# Patient Record
Sex: Female | Born: 1935 | Race: White | Hispanic: No
Health system: Southern US, Community
[De-identification: ages and names within clinical notes are randomized; demographics above are authoritative.]

---

## 2001-01-09 ENCOUNTER — Encounter: Payer: Self-pay | Admitting: Orthopedic Surgery

## 2001-01-14 ENCOUNTER — Encounter: Payer: Self-pay | Admitting: Orthopedic Surgery

## 2001-01-14 ENCOUNTER — Inpatient Hospital Stay (HOSPITAL_COMMUNITY): Admission: RE | Admit: 2001-01-14 | Discharge: 2001-01-20 | Payer: Self-pay | Admitting: Orthopedic Surgery

## 2001-01-17 ENCOUNTER — Encounter: Payer: Self-pay | Admitting: Orthopedic Surgery

## 2001-01-18 ENCOUNTER — Encounter: Payer: Self-pay | Admitting: Orthopedic Surgery

## 2001-06-11 ENCOUNTER — Encounter: Payer: Self-pay | Admitting: Orthopedic Surgery

## 2001-06-13 ENCOUNTER — Encounter: Payer: Self-pay | Admitting: Orthopedic Surgery

## 2001-06-13 ENCOUNTER — Inpatient Hospital Stay (HOSPITAL_COMMUNITY): Admission: RE | Admit: 2001-06-13 | Discharge: 2001-06-18 | Payer: Self-pay | Admitting: Orthopedic Surgery

## 2005-09-15 ENCOUNTER — Ambulatory Visit: Payer: Self-pay | Admitting: Ophthalmology

## 2005-09-25 ENCOUNTER — Ambulatory Visit: Payer: Self-pay | Admitting: Ophthalmology

## 2005-11-23 ENCOUNTER — Ambulatory Visit: Payer: Self-pay | Admitting: Internal Medicine

## 2005-11-28 ENCOUNTER — Encounter: Payer: Self-pay | Admitting: Internal Medicine

## 2005-11-29 ENCOUNTER — Ambulatory Visit: Payer: Self-pay | Admitting: Internal Medicine

## 2005-12-11 ENCOUNTER — Ambulatory Visit: Payer: Self-pay | Admitting: Ophthalmology

## 2005-12-20 ENCOUNTER — Ambulatory Visit: Payer: Self-pay | Admitting: Ophthalmology

## 2006-01-18 ENCOUNTER — Ambulatory Visit: Payer: Self-pay | Admitting: Internal Medicine

## 2006-02-19 ENCOUNTER — Inpatient Hospital Stay: Payer: Self-pay | Admitting: Internal Medicine

## 2006-07-06 ENCOUNTER — Other Ambulatory Visit: Payer: Self-pay

## 2006-07-06 ENCOUNTER — Emergency Department: Payer: Self-pay | Admitting: Emergency Medicine

## 2006-09-20 ENCOUNTER — Ambulatory Visit: Payer: Self-pay | Admitting: Internal Medicine

## 2006-10-08 ENCOUNTER — Ambulatory Visit: Payer: Self-pay | Admitting: Unknown Physician Specialty

## 2007-01-22 ENCOUNTER — Ambulatory Visit: Payer: Self-pay | Admitting: Internal Medicine

## 2007-07-24 ENCOUNTER — Ambulatory Visit: Payer: Self-pay | Admitting: Internal Medicine

## 2007-09-04 ENCOUNTER — Ambulatory Visit (HOSPITAL_COMMUNITY): Admission: RE | Admit: 2007-09-04 | Discharge: 2007-09-04 | Payer: Self-pay | Admitting: *Deleted

## 2008-03-02 ENCOUNTER — Ambulatory Visit: Payer: Self-pay | Admitting: Internal Medicine

## 2008-03-25 ENCOUNTER — Ambulatory Visit: Payer: Self-pay | Admitting: Internal Medicine

## 2008-07-21 ENCOUNTER — Ambulatory Visit: Payer: Self-pay

## 2008-08-10 ENCOUNTER — Ambulatory Visit: Payer: Self-pay | Admitting: Otolaryngology

## 2008-08-10 ENCOUNTER — Other Ambulatory Visit: Payer: Self-pay

## 2008-08-20 ENCOUNTER — Ambulatory Visit: Payer: Self-pay | Admitting: Otolaryngology

## 2009-04-06 ENCOUNTER — Ambulatory Visit: Payer: Self-pay | Admitting: Internal Medicine

## 2009-04-08 ENCOUNTER — Ambulatory Visit: Payer: Self-pay | Admitting: Internal Medicine

## 2009-05-04 ENCOUNTER — Ambulatory Visit: Payer: Self-pay | Admitting: Surgery

## 2009-07-23 ENCOUNTER — Ambulatory Visit: Payer: Self-pay | Admitting: Internal Medicine

## 2009-07-28 ENCOUNTER — Encounter: Payer: Self-pay | Admitting: Cardiovascular Disease

## 2010-04-11 ENCOUNTER — Ambulatory Visit: Payer: Self-pay | Admitting: Internal Medicine

## 2010-08-25 ENCOUNTER — Emergency Department: Payer: Self-pay | Admitting: Emergency Medicine

## 2010-10-13 ENCOUNTER — Ambulatory Visit: Payer: Self-pay | Admitting: Surgery

## 2011-03-28 NOTE — Cardiovascular Report (Signed)
Ashley Roth, Ashley Roth               ACCOUNT NO.:  [id number]   MEDICAL RECORD NO.:  [id number]          PATIENT TYPE:  OIB   LOCATION:  2854                         FACILITY:  [facility]   PHYSICIAN:  Darlin Priestly, MD  DATE OF BIRTH:  Nov 17, 1927   DATE OF PROCEDURE:  DATE OF DISCHARGE:  09/04/2007                            CARDIAC CATHETERIZATION   PROCEDURE:  1. Right heart catheterization.  2. Left heart catheterization.  3. Coronary angiography.  4. Left ventriculogram.  5. Abdominal aortogram.   ATTENDING PHYSICIAN:  Darlin Priestly, MD   COMPLICATIONS:  None.   INDICATIONS:  [name] is a 77 year old female patient of Dr. Bethann Punches in [place], with a history of non-insulin dependent diabetes  mellitus, hypertension, hypercholesterolemia, history of ongoing  shortness of breath and substernal chest tightness.  She is now referred  for cardiac catheterization to rule out significant CAD and evaluate  right heart pressures.   DESCRIPTION OF OPERATION:  After obtaining informed consent, the patient  brought to the cardiac cath lab.  Right groin was shaved, prepped and  draped in sterile fashion.  Received Monistat and modified Seldinger  technique, #7 French venous sheath to the right femoral vein, a #6  [place] arterial sheath to the right femoral artery.  Next under  fluoroscopic guidance, a #7 [place] venous sheath to the right femoral  vein, a #6 [place] arterial sheath to the right femoral artery.  Next,  under fluoroscopic guidance, a #7 [place] Swan-Ganz catheter inserted  into the RA/RV/PA moist position.  The hemodynamic measurements were  obtained.   A #6 French diagnostic catheter was performed via diagnostic  angiography.   Left main is a short large vessel with no significant disease.   The LAD is a medium sized vessel with 2 diagonal branches and a large  septal perforated.  The LAD has no significant disease.   First and second diagonals are small  to medium sized vessels with no  significant disease.   Left coronary artery is a medium sized ramus that bifurcates distal  with no significant disease.   Left circumflex is a medium sized vessel in view with one obtuse  marginal branch.  The AV circumflex has no significant disease.   The first 2 images show a medium sized vessel with 40% ostial lesion.   The right coronary artery is a large vessel, which dominant and gives  rise to a PDA and posterolateral branch.  There is no significant  disease in the RCA, PDA or posterolateral branch.   Left ventriculogram was observed with EF of 60%.   Abdominal aortogram was 60% left distal renal artery fibromuscular  dysplasia.   Hemodynamics:  RA 7, RV 44/6, PA 40/15, pulmonary arterial pressures of  16, systemic arterial pressures 195/79, LV segment pressure 195/10, LV  of 18, cardiac output 5.4, cardiac index 2.9, PA saturation is 67%, SVC  saturation is 95%.   CONCLUSION:  1. Noncritical coronary artery disease (CAD).  2. Normal LV systolic function.  3. 60% left renal artery narrowing secondary to distal fibromuscular  dysplasia.  4. Mild pulmonary hypertension.  5. Cardiac output 5.4, cardiac index 2.9.  6. PA saturation is 67%, SVC saturation is 95%.      Darlin Priestly, MD  Electronically Signed     RHM/MEDQ  D:  09/04/2007  T:  09/05/2007  Job:  [id number]   cc:   Bethann Punches

## 2011-03-31 NOTE — Op Note (Signed)
[place]. [facility]  Patient:    Ashley Roth, Ashley Roth                        MRN: [id number] Proc. Date: 01/14/01 Adm. Date:  [id number] Attending:  Colbert Ewing                           Operative Report  PREOPERATIVE DIAGNOSIS:  End stage degenerative arthritis, right knee with varus alignment and flexion contracture.  POSTOPERATIVE DIAGNOSIS:  End stage degenerative arthritis, right knee with varus alignment and flexion contracture.  OPERATION PERFORMED:  Right total knee replacement utilizing Osteonics prosthesis.  Press-fit #5 posterior stabilizing femoral component.  Cemented #5 tibial component with 12 mm polyethylene insert.  Cemented recessed nonmetal back 26 mm patellar component.  Appropriate soft tissue balancing.  SURGEON:  Loreta Ave, M.D.  ASSISTANT:  Arlys John D. Petrarca, P.A.-C.  ANESTHESIA:  General.  ESTIMATED BLOOD LOSS:  Minimal.  TOURNIQUET TIME:  One hour 30 minutes.  SPECIMENS:  Excised bone and soft tissue.  CULTURES:  None.  COMPLICATIONS:  None.  DRAINS:  Hemovac  x 2.  DRESSING:  Soft compressive with knee immobilizer.  DESCRIPTION OF PROCEDURE:  The patient was brought to the operating room and placed on the operating table in supine position.  After adequate anesthesia had been obtained, right knee examined.  5 to 7 degree flexion contracture, further flexion to 100 degrees.  Alignment in varus with stable ligaments correctable to just about neutral.  Tourniquet applied.  Prepped and draped in the usual sterile fashion.  Exsanguinated with elevation and Esmarch. Tourniquet inflated to 325 mmHg.  Straight incision above the patella down to the tibial tubercle.  Medial parapatellar arthrotomy with appropriate hemostasis with cautery.  Knee exposed.  Grade 4 changes throughout, most marked medially.  Moderate contracture including PCL.  ACL, PCL, remnants of meniscu, hypertrophic tissue, partiarticular spurs  and extensive fat pad all debrided.  Distal femur exposed.  Intramedullary guide placed.  Distal cut set at 5 degrees valgus.  Relatively osteopenic in the distal femur.  Sized to a #5 component.  Jigs put in place.  Definitive cuts made for the posterior stabilizing component.  Degree of osteopenia and osteoporosis distal femur evident and the distal femur was packed with bony cuts to fill the cancellous void in that area. The trial prosthesis put in place and found to fit well with excellent capturing, fixation and alignment.  Trial removed.  All loose bodies and spurs removed.  Tibial spine cut with a saw with appropriate retractors in place.  Sized to a #5 component.  Intramedullary guide placed. A 6 mm cut off the deficient medial side with a 5 degree posterior slope cut. Trials put in place.  With a 12 mm insert, excellent stability, alignment, flexion and extension set at 5 degrees of valgus. Tibial was marked for rotation and then hand reamed.  The patella was then sized, reamed and drilled for a 26 mm component.  The knee examined with all trials in place.  Full extension, full flexion, good stability, good alignment, no component lift off with  flexion and good patellofemoral tracking.  There had been a significant release medially to be able to get the knee into good alignment despite valgus.  Wound irrigated after all trials removed.  Pulse irrigating device used to further irrigate this.  Cement prepared and placed on the tibial component which  was hammered in place.  Polyethylene attached.  Patellar component seated and held in place with a clamp after excessive cement removed.  Femoral component seated.  Knee reduced.  Once the cement had hardened, the knee was re-examined with good extension, good flexion, good stability, good alignment and good patellofemoral tracking.  Hemovacs placed and brought out through separate stab wounds.  Arthrotomy closed with #1 Vicryl, skin and  subcutaneous tissues with Vicryl and staples.  Margins of the wound and knee injected with Marcaine.  Sterile compressive dressing applied. Tourniquet deflated and removed.  Knee immobilizer applied.  Anesthesia reversed.  Brought to recovery room.  Tolerated surgery well.  No complications. DD:  01/14/01 TD:  01/14/01 Job: [id number]

## 2011-03-31 NOTE — Procedures (Signed)
[place]. [facility]  Patient:    Ashley Roth, Ashley Roth                        MRN: [id number] Proc. Date: 06/13/01 Adm. Date:  06/13/01 Attending:  Judie Petit, M.D.                           Procedure Report  PROCEDURE: Epidural placement.  ANESTHESIOLOGIST: Judie Petit, M.D.  INDICATIONS FOR PROCEDURE: The patient is a 77 year old female, who was scheduled to have left total knee replacement by Dr. Eulah Pont today.  Dr. Eulah Pont and the patient requested epidural for postoperative pain management.  The procedure was discussed in detail with the patient preoperatively.  The risks of bleeding, nerve damage, spinal headache, etc. were discussed in detail with the patient.  Questions were answered and consent given.  The patient understood the procedure would be performed in the operating room after the procedure while under general anesthesia.  DESCRIPTION OF PROCEDURE: The patient tolerated the above-mentioned procedure well.  The patient was placed in the lateral decubitus position and the L3-4 interspace subsequently prepped and draped with Betadine.  A 17 gauge Tuohy needle was used with loss of resistance technique with preservative-free normal saline.  There was no heme or CSF aspiration.  Test dose was negative with 3 cc of 1.5% xylocaine with epinephrine 1:200,000.  The catheter was placed 3 cm without difficulty.  The needle was subsequently withdrawn without difficulty.  The catheter was then affixed to the patients back.  The patient was turned to the supine position and extubated and taken to the post anesthesia care unit.  The patient will be placed on a Marcaine and Fentanyl infusion and will be followed by the anesthesia team.  The patient had been placed in the left lateral decubitus position. DD:  06/13/01 TD:  06/14/01 Job: [id number]

## 2011-03-31 NOTE — Procedures (Signed)
[place]. [facility]  Patient:    Ashley Roth, Ashley Roth                        MRN: [id number] Proc. Date: 01/14/01 Adm. Date:  [id number] Attending:  Colbert Ewing                           Procedure Report  PROCEDURE PERFORMED:  Epidural catheter placement.  ANESTHESIOLOGIST:  Janetta Hora. Gelene Mink, M.D.  INDICATIONS FOR PROCEDURE:  I was consulted by Dr. Richardson Landry to provide postoperative pain relief for his patient, Ashley Roth.  We decided this should take the form of epidural catheter placement and postoperative monitoring by the anesthesiologist.  In the preoperative period the risks and benefits of the of the epidural catheter placement and therapy were discussed with the patient in the preoperative holding area.  The patient expressed some questions related to her current chronic low back pain.  We discussed the natural course of low back pain and its relationship with epidural catheter  placement.  After the patient considered this and had her questions answered, she wished to have the epidural catheter placed and utilized for postoperative analgesia.  DESCRIPTION OF PROCEDURE:  At the completion of the operative procedure, the patient was turned to the right lateral decubitus position.  The back was sprayed liberally with Betadine solution, the area was draped.  A #18 Husted needle was used in a paramedian approach at the L1-2 interspace.  A loss of resistance technique with air was used.  The epidural space was located easily on the first attempt.  A Buerhenne catheter was passed 5 cm into the epidural space.  The catheter passed easily.  The needle was removed without apparant movement of the catheter.  The catheter was aspirated.  There was no evidence of cerebrospinal fluid or blood.  The catheter was injected with a solution containing 10 cc of sterile preservative free normal saline and 5 cc of 1% lidocaine plain.  The catheter injected  easily.  Repeat aspiration revealed no evidence of cerebrospinal fluid or blood.  The catheter was taped securely  in place.  The patient was placed in a supine position and emerged uneventfully from the anesthetic.  The patient was brought to recovery room in good condition. DD:  01/14/01 TD:  01/14/01 Job: [id number]

## 2011-03-31 NOTE — Discharge Summary (Signed)
[place]. [facility]  Patient:    Ashley Roth, Ashley Roth Visit Number: [id number] MRN: [id number]          Service Type: SUR Location: [id number] Attending Physician:  Colbert Ewing Dictated by:   Oris Drone Petrarca, P.A.-C. Admit Date:  06/13/2001 Discharge Date: 06/18/2001                             Discharge Summary  ADMISSION DIAGNOSES:  Advanced degenerative joint disease of the left knee.  DISCHARGE DIAGNOSES: 1. Advanced degenerative joint disease of the left knee. 2. Urinary tract infection. 3. History of hypertension. 4. Diabetes mellitus type 2, non-insulin-dependent. 5. Obesity.  PROCEDURE:  Left total knee replacement.  HISTORY OF PRESENT ILLNESS:  This patient is a 77 year old female status post right total knee replacement.  She has done very well with the right which was performed in February 2002.  She is now having pain with ambulation and activities of daily living with her left knee.  She has failed conservative treatment.  She is not indicated for a left total knee replacement.  HOSPITAL COURSE:  A 77 year old female admitted June 13, 2001 after appropriate laboratory studies were obtained as well as 1 g vancomycin IV on-call to the operating room.  Was taken to the operating room where she underwent a left total knee replacement.  She tolerated the procedure well. An epidural was placed postoperatively for pain management.  Heparin 5000 units subcutaneous q.12h. was started until her Coumadin became therapeutic. She was continued with her preoperative medicines.  Consultation with PT/OT, rehabilitation, social service was ordered.  A CPM was placed 0-60 degrees for eight hours per day, incremented by 10 degrees a day.  Knee immobilizer for ambulation.  She will be able to ambulate, weightbearing as tolerated on the left with a walker.  She was placed on ADA diet.  Her Hemovacs were pulled and on postoperative day #2.  She  was begun on Diflucan 150 mg p.o. and Cipro 500 mg p.o. b.i.d. for seven days on June 14, 2001 for a urinary tract infection. Her epidural was discontinued June 14, 2001.  A PCA morphine pump was begun on the second for pain management once her epidural was discontinued.  PCA was discontinued on August 3 and she was placed on Darvocet-N 100 one to two q.4h. p.r.n. pain or Vicodin ES one to two q.4h. p.r.n. pain.  The remainder of her hospital course was uneventful and she was discharged on June 18, 2001 where she will return back to the office in one week for staple removal.  She was discharged in improved condition.  LABORATORIES:  Radiographic studies of August 1 revealed total knee replacement in expected position.  No evidence of fracture or dislocation.  July 30:  Hemoglobin 13.5, hematocrit 40.1%, white count 9200, platelets 391,000.  August 5:  Hemoglobin 8.9, hematocrit 26.3%, white count 9600, platelets 379,000.  Chemistries July 30:  Sodium 138, potassium 4.2, chloride 101, CO2 27, glucose 75, BUN 16, creatinine 0.9, calcium 9.9, total protein 6.9, albumin 4.0, AST 25, ALT 21, ALP 86, total bilirubin 0.7.  June 17, 2001:  Sodium 138, potassium 3.3, chloride 100, CO2 29, glucose 113, BUN 6, creatinine 0.9, calcium 8.5.  Urinalysis showed a benign urine on July 30 except for 3-6 whites, 0-2 reds, and many bacteria.  Her blood type was O+. Antibody screen was positive with an antibody of anti-C.  DISCHARGE  MEDICATIONS: 1. Percocet 5/325 one to two tablets q.4h. p.r.n. pain. 2. Colace 100 mg p.o. b.i.d. 3. Coumadin 5 mg one q.d. unless adjusted by home health. 4. Cipro one tablet 500 mg b.i.d. for five days.  ACTIVITY:  Ambulate as tolerated with no restrictions.  Weightbearing as tolerated.  DIET:  House diet.  WOUND CARE:  Keep wound clean and dry.  DISCHARGE INSTRUCTIONS:  Call if she has any problems in the interim.  FOLLOW-UP:  She will follow back up in about  7-10 days for staple removal in the office.  CONDITION ON DISCHARGE:  Improved. Dictated by:   Oris Drone Petrarca, P.A.-C. Attending Physician:  Colbert Ewing DD:  08/01/01 TD:  08/01/01 Job: [id number]

## 2011-03-31 NOTE — Op Note (Signed)
[place]. [facility]  Patient:    Ashley Roth, Ashley Roth                        MRN: [id number] Proc. Date: 01/14/01 Adm. Date:  [id number] Attending:  Colbert Ewing                           Operative Report  PREOPERATIVE DIAGNOSIS:  End-stage degenerative arthritis - right knee with varus alignment and flexion contracture.  POSTOPERATIVE DIAGNOSIS:  End-stage degenerative arthritis - right knee with varus alignment and flexion contracture.  OPERATIVE PROCEDURE:  Right total knee replacement utilizing Osteonics prosthesis.  Press-fit #5, posterior stabilizing femoral component, cemented #5 tibial component with 12 mm polyethylene insert.  Cemented, recessed, nonmetal-backed 26 mm patella component.  Appropriate soft tissue balancing.  SURGEON:  Loreta Ave, M.D.  ASSISTANT:  Arlys John D. Petrarca, P.A.-C.  ANESTHESIA:  General.  BLOOD LOSS:  Minimal.  TOURNIQUET TIME:  One hour and 30 minutes.  SPECIMENS:  Excised bone and soft tissue.  CULTURES:  None.  COMPLICATIONS:  None.  DRESSING:  Soft compressive with knee immobilizer.  DRAINS:  Hemovac x 2.  DESCRIPTION OF PROCEDURE:  Patient was brought to the operating room, placed on operating table in supine position.  After adequate anesthesia had been obtained, right knee examined.  Five to 7 degree flexion contracture, further flexion to 100 degrees.  Alignment in varus with stable ligaments correctable to just about neutral.  Tourniquet applied, prepped and draped in usual sterile fashion.  Exsanguinated with elevation, Esmarch and tourniquet inflated to 325 mmHg.  Straight incision above the patella down to the tibial tubercle.  Medial parapatellar arthrotomy with appropriate hemostasis with cautery.  Knee exposed.  Grade 4 changes throughout, most marked medially. Moderate contracture including PCL.  ACL, PCL, and remnants of menisci, hypertrophic tissue, periarticular spurs and  excessive fat pad all debrided. Distal femur exposed.  Intramedullary guide placed.  Distal cut set at 5 degrees of valgus.  Relatively osteopenic in the distal femur.  Sized for a #5 component.  Jigs put into place and definitive cuts made for the posterior stabilizing component.  Degree of osteopenia and osteoporosis distal femur evident and the distal femur was packed with bony cuts to fill the cancellous void in that area.  The trial prosthesis put in place and found to fit well with excellent capturing, fixation and alignment.  Trial removed.  All loose bodies and spurs removed.  Tibial spine cut with a saw with appropriate retractors in place.  Sized for a #5 component.  Intramedullary guide placed. A 6 mm cut off the deficient medial side with a 5 degree posterior slope cut. Trials put in place.  With the 12 mm insert, excellent stability, alignment, flexion and extension set at 5 degrees of valgus.  Tibia was marked for rotation and then hand reamed.  Patella was sized, reamed and drilled for a 26 mm component.  Knee examined with all trials in place.  Full extension, full flexion, good stability, good alignment, no component lift-off with flexion and good patellofemoral tracking.  There had been a significant release medially to be able to get the knee into good alignment in slight valgus. Wound irrigated after all trials removed.  Pulse irrigating device used to further irrigate this.  Cement prepared, placed on the tibial component, which was hammered in place.  Polyethylene attached.  Patellar component seated and  held in place with a clamp after excessive cement removed.   Femoral component seated.  Knee reduced.  Once the cement had hardened, the knee was re-examined with good extension, good flexion, good stability, good alignment and good patellofemoral tracking.  Hemovacs placed and brought out through separate stab wounds.  Arthrotomy closed with #1 Vicryl.  Skin and  subcutaneous tissue with Vicryl and staples.  Margins of the wound and knee injected with Marcaine.  Sterile compressive dressing applied, tourniquet deflated and removed, knee immobilizer applied.  Anesthesia reversed, brought to recovery room.  Tolerated surgery well, no complications. DD:  01/14/01 TD:  01/14/01 Job: [id number]

## 2011-03-31 NOTE — Op Note (Signed)
[place]. [facility]  Patient:    Ashley Roth, Ashley Roth                        MRN: [id number] Proc. Date: 06/13/01 Adm. Date:  06/13/01 Attending:  Loreta Ave, M.D.                           Operative Report  PREOPERATIVE DIAGNOSES:  End-stage degenerative joint disease left knee, with varus alignment and flexion contracture.  POSTOPERATIVE DIAGNOSES:  End-stage degenerative joint disease left knee, with varus alignment and flexion contracture.  PROCEDURE:  Left total knee replacement Osteonics prosthesis, press-fit #5 femoral component, posterior stabilizing.  Cemented #5 tibial component with 10 mm polyethylene insert.  Cemented recess nonmetal backed patellar component 26 mm.  Appropriate soft tissue balance and lateral retinacular release.  SURGEON:  Loreta Ave, M.D.  ASSISTANT:  Arlys John D. Petrarca, P.A.-C.  ANESTHESIA:  General.  ESTIMATED BLOOD LOSS:  Minimal.  TOURNIQUET TIME:  One hour 10 minutes.  SPECIMENS:  Specimens excised, bone and soft tissue.  CULTURES:  None.  COMPLICATIONS:  None.  DRESSINGS:  Soft compressive with knee immobilizer.  DRAINS:  Hemovac x 2.  DESCRIPTION OF PROCEDURE:  The patient was brought to the operating room and placed on the operating table in supine position.  After adequate anesthesia had been obtained, the left knee was examined.  Five degree flexion contracture, alignment in varus, correctable to neutral.  Ligament stable. Further flexion to 100 degrees.  Tourniquet applied, prepped and draped in the usual sterile fashion.  Exsanguinated with elevation of the Esmarch. Tourniquet inflated to 350 mm of mercury.  Straight incision above the patella down to the tibial tubercle.  Skin and subcutaneous tissue divided.  Medial parapatellar arthrotomy.  KNee exposed.  Periarticular spurring, contracted cruciate ligaments, remnants of menisci, hypertrophic tissue, surrounding spurs, and lose bodies all  removed.  Distal femur exposed.  Intermedullary guide put placed.  Distal cut removing 10 mm set at 5 degrees of valgus.  Sized to a #5 component.  Jigs put in place, definitive cuts made for the posterior stabilizing component.  Trial put in place and found to fit well. Trial removed.  Tibia exposed with appropriate retractors protecting posterior and collateral structures.  Tibial spine was removed with a saw.  Intermedullary guide placed.  Proximal cut removing 4 mm with a 5 degree posterior slope cut.  Trials put in place. Nicely balanced with full extension, full flexion, and good balancing of the knee with a 10 mm insert.  Tibia marked for rotation and then reamed for the cruciate portion of the component.  Trials reinserted.  Patella was sized, reamed and drilled for 26 mm component.  Lateral release performed as it was necessary to balance the patellofemoral joint.  Performed from inside out with cautery.  Once complete good tracking.  All trials removed.  Copious irrigation with a pulse irrigating device.  Cement prepared and placed on the tibial and patellar components which were compressed in place.  Excessive cement removed.  Polyethylene attached to the tibia.  The femoral component hammered in place.  The knee reduced.  Reexamined with full extension, full flexion, nicely balanced, stable, with good stability set at 5 degrees of valgus.  Good patellofemoral tracking after lateral release.  Clamps were held in place until the cement had completely hardened.  The knee was reexamined again with very acceptable motion alignment  and stability. Wounds irrigated.  A Hemovac was placed and brought out through separate stab wounds.  The wound closed with 1 Vicryl in the arthrotomy, Vicryl and staples in the subcutaneous tissue and skin, margins of the wound and knee injected with Marcaine and Hemovac clamped.  Sterile compressive dressing applied. Tourniquet deflated and removed.   The immobilizer applied.  Anesthesia reversed, brought to recovery room, tolerated surgery well.  NO complications. DD:  06/13/01 TD:  06/13/01 Job: [id number]

## 2011-04-19 ENCOUNTER — Ambulatory Visit: Payer: Self-pay | Admitting: Internal Medicine

## 2011-08-11 ENCOUNTER — Observation Stay: Payer: Self-pay | Admitting: Internal Medicine

## 2011-08-23 LAB — POCT I-STAT 3, ART BLOOD GAS (G3+)
Acid-Base Excess: 1
Bicarbonate: 24.8 — ABNORMAL HIGH
O2 Saturation: 95
Operator id: 172131
TCO2: 26
pCO2 arterial: 36.6
pH, Arterial: 7.44 — ABNORMAL HIGH
pO2, Arterial: 70 — ABNORMAL LOW

## 2011-08-23 LAB — POCT I-STAT 3, VENOUS BLOOD GAS (G3P V)
Acid-base deficit: 1
Bicarbonate: 23.9
O2 Saturation: 67
Operator id: 172131
TCO2: 25
pCO2, Ven: 39.6 — ABNORMAL LOW
pH, Ven: 7.388 — ABNORMAL HIGH
pO2, Ven: 35

## 2012-04-19 ENCOUNTER — Ambulatory Visit: Payer: Self-pay | Admitting: Internal Medicine

## 2012-04-30 ENCOUNTER — Ambulatory Visit: Payer: Self-pay

## 2012-05-01 ENCOUNTER — Ambulatory Visit: Payer: Self-pay | Admitting: Oncology

## 2012-05-02 ENCOUNTER — Ambulatory Visit: Payer: Self-pay | Admitting: Oncology

## 2012-05-02 LAB — CBC CANCER CENTER
Bands: 2 %
Basophil: 2 %
Eosinophil: 1 %
HCT: 28.1 % — ABNORMAL LOW (ref 35.0–47.0)
HGB: 9.1 g/dL — ABNORMAL LOW (ref 12.0–16.0)
Lymphocytes: 14 %
MCH: 28.1 pg (ref 26.0–34.0)
MCHC: 32.5 g/dL (ref 32.0–36.0)
MCV: 87 fL (ref 80–100)
Platelet: 85 x10 3/mm — ABNORMAL LOW (ref 150–440)
RBC: 3.25 10*6/uL — ABNORMAL LOW (ref 3.80–5.20)
Segmented Neutrophils: 64 %
WBC: 7.9 x10 3/mm (ref 3.6–11.0)

## 2012-05-02 LAB — COMPREHENSIVE METABOLIC PANEL
Albumin: 3.8 g/dL (ref 3.4–5.0)
Alkaline Phosphatase: 98 U/L (ref 50–136)
BUN: 13 mg/dL (ref 7–18)
Calcium, Total: 8.9 mg/dL (ref 8.5–10.1)
Chloride: 103 mmol/L (ref 98–107)
Co2: 26 mmol/L (ref 21–32)
Creatinine: 0.83 mg/dL (ref 0.60–1.30)
EGFR (African American): >60
EGFR (Non-African Amer.): >60
Osmolality: 279 (ref 275–301)
Potassium: 4.1 mmol/L (ref 3.5–5.1)
SGOT(AST): 27 U/L (ref 15–37)
Sodium: 137 mmol/L (ref 136–145)
Total Protein: 6.8 g/dL (ref 6.4–8.2)

## 2012-05-02 LAB — LACTATE DEHYDROGENASE: LDH: 579 U/L — ABNORMAL HIGH (ref 84–246)

## 2012-05-13 ENCOUNTER — Ambulatory Visit: Payer: Self-pay | Admitting: Oncology

## 2012-05-15 LAB — CBC CANCER CENTER
Basophil #: 0.1 x10 3/mm (ref 0.0–0.1)
Basophil %: 0.9 %
Eosinophil #: 0.2 x10 3/mm (ref 0.0–0.7)
HCT: 28.8 % — ABNORMAL LOW (ref 35.0–47.0)
Lymphocyte #: 1.4 x10 3/mm (ref 1.0–3.6)
Lymphocyte %: 14.3 %
MCH: 28.5 pg (ref 26.0–34.0)
MCHC: 32.9 g/dL (ref 32.0–36.0)
MCV: 87 fL (ref 80–100)
Monocyte #: 1.9 x10 3/mm — ABNORMAL HIGH (ref 0.2–0.9)
Neutrophil #: 6.4 x10 3/mm (ref 1.4–6.5)
RDW: 22.5 % — ABNORMAL HIGH (ref 11.5–14.5)
WBC: 10 x10 3/mm (ref 3.6–11.0)

## 2012-05-22 LAB — CBC CANCER CENTER
Basophil #: 0.1 x10 3/mm (ref 0.0–0.1)
Basophil %: 1.3 %
Eosinophil #: 0.2 x10 3/mm (ref 0.0–0.7)
Eosinophil %: 2.1 %
HCT: 27.9 % — ABNORMAL LOW (ref 35.0–47.0)
MCH: 28.3 pg (ref 26.0–34.0)
MCV: 87 fL (ref 80–100)
Monocyte #: 1.9 x10 3/mm — ABNORMAL HIGH (ref 0.2–0.9)
Monocyte %: 18.9 %
Neutrophil #: 6.7 x10 3/mm — ABNORMAL HIGH (ref 1.4–6.5)
Neutrophil %: 64.9 %
Platelet: 83 x10 3/mm — ABNORMAL LOW (ref 150–440)
RBC: 3.2 10*6/uL — ABNORMAL LOW (ref 3.80–5.20)
RDW: 23.3 % — ABNORMAL HIGH (ref 11.5–14.5)
WBC: 10.3 x10 3/mm (ref 3.6–11.0)

## 2012-05-29 LAB — CBC CANCER CENTER
Bands: 8 %
Basophil #: 0.1 x10 3/mm (ref 0.0–0.1)
Basophil %: 0.7 %
Basophil: 1 %
Eosinophil #: 0.2 x10 3/mm (ref 0.0–0.7)
Eosinophil %: 2.4 %
HCT: 27.1 % — ABNORMAL LOW (ref 35.0–47.0)
HGB: 8.9 g/dL — ABNORMAL LOW (ref 12.0–16.0)
Lymphocyte #: 1.2 x10 3/mm (ref 1.0–3.6)
MCH: 28.7 pg (ref 26.0–34.0)
MCHC: 32.9 g/dL (ref 32.0–36.0)
MCV: 87 fL (ref 80–100)
Metamyelocyte: 2 %
Monocyte %: 18.5 %
Monocytes: 12 %
Myelocyte: 2 %
NRBC/100 WBC: 4 /100
Neutrophil #: 5.4 x10 3/mm (ref 1.4–6.5)
Neutrophil %: 64.2 %
Platelet: 80 x10 3/mm — ABNORMAL LOW (ref 150–440)
RBC: 3.12 10*6/uL — ABNORMAL LOW (ref 3.80–5.20)
RDW: 22.9 % — ABNORMAL HIGH (ref 11.5–14.5)

## 2012-05-29 LAB — COMPREHENSIVE METABOLIC PANEL
Albumin: 3.9 g/dL (ref 3.4–5.0)
Alkaline Phosphatase: 98 U/L (ref 50–136)
Anion Gap: 9 (ref 7–16)
BUN: 14 mg/dL (ref 7–18)
Bilirubin,Total: 0.8 mg/dL (ref 0.2–1.0)
Chloride: 98 mmol/L (ref 98–107)
Creatinine: 1.09 mg/dL (ref 0.60–1.30)
EGFR (African American): 55 — ABNORMAL LOW
Glucose: 239 mg/dL — ABNORMAL HIGH (ref 65–99)
Potassium: 4.4 mmol/L (ref 3.5–5.1)
SGOT(AST): 29 U/L (ref 15–37)
Total Protein: 6.7 g/dL (ref 6.4–8.2)

## 2012-06-03 ENCOUNTER — Ambulatory Visit: Payer: Self-pay | Admitting: Gastroenterology

## 2012-06-04 LAB — CBC CANCER CENTER
Basophil: 1 %
Eosinophil: 3 %
HCT: 26.6 % — ABNORMAL LOW (ref 35.0–47.0)
HGB: 8.8 g/dL — ABNORMAL LOW (ref 12.0–16.0)
Lymphocytes: 15 %
MCHC: 32.9 g/dL (ref 32.0–36.0)
MCV: 87 fL (ref 80–100)
Monocytes: 10 %
Myelocyte: 2 %
NRBC/100 WBC: 4 /100
Platelet: 83 x10 3/mm — ABNORMAL LOW (ref 150–440)
Segmented Neutrophils: 55 %
WBC: 7.1 x10 3/mm (ref 3.6–11.0)

## 2012-06-13 ENCOUNTER — Ambulatory Visit: Payer: Self-pay | Admitting: Oncology

## 2012-06-24 LAB — CANCER CENTER HEMOGLOBIN: HGB: 9.2 g/dL — ABNORMAL LOW (ref 12.0–16.0)

## 2012-07-01 LAB — CANCER CENTER HEMOGLOBIN: HGB: 8.7 g/dL — ABNORMAL LOW (ref 12.0–16.0)

## 2012-07-08 LAB — CANCER CENTER HEMOGLOBIN: HGB: 8.3 g/dL — ABNORMAL LOW (ref 12.0–16.0)

## 2012-07-14 ENCOUNTER — Ambulatory Visit: Payer: Self-pay | Admitting: Oncology

## 2012-07-16 LAB — CANCER CENTER HEMOGLOBIN: HGB: 8.2 g/dL — ABNORMAL LOW (ref 12.0–16.0)

## 2012-07-22 LAB — CBC CANCER CENTER
Basophil #: 0.1 x10 3/mm (ref 0.0–0.1)
Basophil %: 0.9 %
Basophil: 1 %
Blast: 1 %
Eosinophil #: 0.2 x10 3/mm (ref 0.0–0.7)
Eosinophil: 1 %
HCT: 25.8 % — ABNORMAL LOW (ref 35.0–47.0)
HGB: 8.5 g/dL — ABNORMAL LOW (ref 12.0–16.0)
Lymphocyte #: 1.6 x10 3/mm (ref 1.0–3.6)
Lymphocyte %: 13.7 %
Lymphocytes: 16 %
MCH: 28.9 pg (ref 26.0–34.0)
MCHC: 32.9 g/dL (ref 32.0–36.0)
MCV: 88 fL (ref 80–100)
Metamyelocyte: 8 %
Monocyte #: 2.2 x10 3/mm — ABNORMAL HIGH (ref 0.2–0.9)
Myelocyte: 7 %
NRBC/100 WBC: 10 /100
Neutrophil #: 7.8 x10 3/mm — ABNORMAL HIGH (ref 1.4–6.5)
Neutrophil %: 65.3 %
Platelet: 57 x10 3/mm — ABNORMAL LOW (ref 150–440)
Promyelocyte: 2 %
RDW: 23.3 % — ABNORMAL HIGH (ref 11.5–14.5)

## 2012-07-22 LAB — BASIC METABOLIC PANEL
Calcium, Total: 8.6 mg/dL (ref 8.5–10.1)
Co2: 27 mmol/L (ref 21–32)
EGFR (African American): 58 — ABNORMAL LOW
Glucose: 152 mg/dL — ABNORMAL HIGH (ref 65–99)
Osmolality: 265 (ref 275–301)
Sodium: 131 mmol/L — ABNORMAL LOW (ref 136–145)

## 2012-08-05 LAB — CANCER CENTER HEMOGLOBIN: HGB: 8 g/dL — ABNORMAL LOW (ref 12.0–16.0)

## 2012-08-13 ENCOUNTER — Ambulatory Visit: Payer: Self-pay | Admitting: Oncology

## 2012-08-19 LAB — CBC CANCER CENTER
Bands: 13 %
Basophil: 1 %
Eosinophil: 2 %
HCT: 26.1 % — ABNORMAL LOW (ref 35.0–47.0)
HGB: 8.3 g/dL — ABNORMAL LOW (ref 12.0–16.0)
Lymphocytes: 6 %
MCH: 28.6 pg (ref 26.0–34.0)
MCHC: 31.9 g/dL — ABNORMAL LOW (ref 32.0–36.0)
MCV: 90 fL (ref 80–100)
Myelocyte: 8 %
NRBC/100 WBC: 26 /100
Platelet: 36 x10 3/mm — ABNORMAL LOW (ref 150–440)
RBC: 2.91 10*6/uL — ABNORMAL LOW (ref 3.80–5.20)
RDW: 24.1 % — ABNORMAL HIGH (ref 11.5–14.5)
Segmented Neutrophils: 48 %

## 2012-08-26 LAB — CANCER CENTER HEMOGLOBIN: HGB: 8.7 g/dL — ABNORMAL LOW (ref 12.0–16.0)

## 2012-09-02 LAB — CANCER CENTER HEMOGLOBIN: HGB: 7.8 g/dL — ABNORMAL LOW (ref 12.0–16.0)

## 2012-09-09 LAB — CANCER CENTER HEMOGLOBIN: HGB: 7.6 g/dL — ABNORMAL LOW (ref 12.0–16.0)

## 2012-09-13 ENCOUNTER — Ambulatory Visit: Payer: Self-pay | Admitting: Oncology

## 2012-09-16 LAB — CBC CANCER CENTER
Basophil: 4 %
Blast: 1 %
HCT: 25 % — ABNORMAL LOW (ref 35.0–47.0)
MCH: 28.6 pg (ref 26.0–34.0)
MCHC: 31.3 g/dL — ABNORMAL LOW (ref 32.0–36.0)
MCV: 91 fL (ref 80–100)
Metamyelocyte: 8 %
Myelocyte: 2 %
NRBC/100 WBC: 16 /100
Platelet: 60 x10 3/mm — ABNORMAL LOW (ref 150–440)
RDW: 25.3 % — ABNORMAL HIGH (ref 11.5–14.5)

## 2012-09-16 LAB — COMPREHENSIVE METABOLIC PANEL
Albumin: 3.4 g/dL (ref 3.4–5.0)
Alkaline Phosphatase: 94 U/L (ref 50–136)
Anion Gap: 13 (ref 7–16)
BUN: 11 mg/dL (ref 7–18)
Bilirubin,Total: 1.5 mg/dL — ABNORMAL HIGH (ref 0.2–1.0)
Chloride: 94 mmol/L — ABNORMAL LOW (ref 98–107)
Co2: 23 mmol/L (ref 21–32)
Creatinine: 0.86 mg/dL (ref 0.60–1.30)
Glucose: 185 mg/dL — ABNORMAL HIGH (ref 65–99)
SGOT(AST): 53 U/L — ABNORMAL HIGH (ref 15–37)
SGPT (ALT): 37 U/L (ref 12–78)
Total Protein: 6.4 g/dL (ref 6.4–8.2)

## 2012-10-08 ENCOUNTER — Ambulatory Visit: Payer: Self-pay | Admitting: Otolaryngology

## 2012-10-13 ENCOUNTER — Ambulatory Visit: Payer: Self-pay | Admitting: Oncology

## 2012-10-14 LAB — CBC CANCER CENTER
Bands: 12 %
Basophil: 6 %
HGB: 7.3 g/dL — ABNORMAL LOW (ref 12.0–16.0)
MCH: 29.1 pg (ref 26.0–34.0)
MCHC: 33.1 g/dL (ref 32.0–36.0)
MCV: 88 fL (ref 80–100)
Metamyelocyte: 7 %
Monocytes: 5 %
Myelocyte: 9 %
RBC: 2.52 10*6/uL — ABNORMAL LOW (ref 3.80–5.20)
RDW: 22.6 % — ABNORMAL HIGH (ref 11.5–14.5)
Segmented Neutrophils: 41 %
Variant Lymphocyte: 1 %
WBC: 33.6 x10 3/mm — ABNORMAL HIGH (ref 3.6–11.0)

## 2012-10-14 LAB — BASIC METABOLIC PANEL
BUN: 8 mg/dL (ref 7–18)
Chloride: 98 mmol/L (ref 98–107)
Creatinine: 0.9 mg/dL (ref 0.60–1.30)
EGFR (African American): >60
EGFR (Non-African Amer.): 60 — ABNORMAL LOW
Glucose: 141 mg/dL — ABNORMAL HIGH (ref 65–99)
Potassium: 3.7 mmol/L (ref 3.5–5.1)
Sodium: 134 mmol/L — ABNORMAL LOW (ref 136–145)

## 2012-11-11 LAB — CBC CANCER CENTER
Bands: 10 %
Basophil: 3 %
Blast: 6 %
HCT: 22 % — ABNORMAL LOW (ref 35.0–47.0)
HGB: 7 g/dL — ABNORMAL LOW (ref 12.0–16.0)
Lymphocytes: 5 %
MCHC: 32 g/dL (ref 32.0–36.0)
MCV: 98 fL (ref 80–100)
Monocytes: 8 %
NRBC/100 WBC: 49 /100
Promyelocyte: 5 %
RBC: 2.24 10*6/uL — ABNORMAL LOW (ref 3.80–5.20)
RDW: 24.6 % — ABNORMAL HIGH (ref 11.5–14.5)
Segmented Neutrophils: 38 %
WBC: 56 x10 3/mm — ABNORMAL HIGH (ref 3.6–11.0)

## 2012-11-11 LAB — COMPREHENSIVE METABOLIC PANEL
Anion Gap: 15 (ref 7–16)
Calcium, Total: 8.3 mg/dL — ABNORMAL LOW (ref 8.5–10.1)
Chloride: 97 mmol/L — ABNORMAL LOW (ref 98–107)
Co2: 20 mmol/L — ABNORMAL LOW (ref 21–32)
EGFR (African American): >60
EGFR (Non-African Amer.): 56 — ABNORMAL LOW
Glucose: 273 mg/dL — ABNORMAL HIGH (ref 65–99)
Osmolality: 274 (ref 275–301)
Potassium: 4.5 mmol/L (ref 3.5–5.1)
SGOT(AST): 67 U/L — ABNORMAL HIGH (ref 15–37)
Total Protein: 6.7 g/dL (ref 6.4–8.2)

## 2012-11-13 ENCOUNTER — Ambulatory Visit: Payer: Self-pay | Admitting: Oncology

## 2012-11-19 ENCOUNTER — Inpatient Hospital Stay: Payer: Self-pay | Admitting: Family Medicine

## 2012-11-19 LAB — CBC CANCER CENTER
Basophil: 6 %
Comment - H1-Com4: NORMAL
Eosinophil: 2 %
HGB: 6.7 g/dL — ABNORMAL LOW (ref 12.0–16.0)
MCH: 29.9 pg (ref 26.0–34.0)
MCV: 96 fL (ref 80–100)
Myelocyte: 9 %
NRBC/100 WBC: 43 /100
Platelet: 45 x10 3/mm — ABNORMAL LOW (ref 150–440)
Promyelocyte: 5 %
RDW: 22.9 % — ABNORMAL HIGH (ref 11.5–14.5)
WBC: 73.6 x10 3/mm — ABNORMAL HIGH (ref 3.6–11.0)

## 2012-11-19 LAB — IRON AND TIBC
Iron Saturation: 32 %
Iron: 85 ug/dL (ref 50–170)
Unbound Iron-Bind.Cap.: 180 ug/dL

## 2012-11-20 LAB — CBC WITH DIFFERENTIAL/PLATELET
Basophil: 5 %
Blast: 5 %
HCT: 23.2 % — ABNORMAL LOW (ref 35.0–47.0)
HGB: 7.9 g/dL — ABNORMAL LOW (ref 12.0–16.0)
Lymphocytes: 10 %
MCH: 30.5 pg (ref 26.0–34.0)
MCV: 90 fL (ref 80–100)
Monocytes: 6 %
Myelocyte: 5 %
NRBC/100 WBC: 76 /
Platelet: 36 10*3/uL — ABNORMAL LOW (ref 150–440)
RBC: 2.58 10*6/uL — ABNORMAL LOW (ref 3.80–5.20)
RDW: 20.7 % — ABNORMAL HIGH (ref 11.5–14.5)
WBC: 45 10*3/uL — ABNORMAL HIGH (ref 3.6–11.0)

## 2012-11-20 LAB — COMPREHENSIVE METABOLIC PANEL
Albumin: 3.3 g/dL — ABNORMAL LOW (ref 3.4–5.0)
Bilirubin,Total: 4.8 mg/dL — ABNORMAL HIGH (ref 0.2–1.0)
Calcium, Total: 7.6 mg/dL — ABNORMAL LOW (ref 8.5–10.1)
Chloride: 95 mmol/L — ABNORMAL LOW (ref 98–107)
Co2: 22 mmol/L (ref 21–32)
Creatinine: 0.53 mg/dL — ABNORMAL LOW (ref 0.60–1.30)
EGFR (Non-African Amer.): >60
Osmolality: 262 (ref 275–301)
Potassium: 3.4 mmol/L — ABNORMAL LOW (ref 3.5–5.1)
SGOT(AST): 80 U/L — ABNORMAL HIGH (ref 15–37)
Sodium: 128 mmol/L — ABNORMAL LOW (ref 136–145)
Total Protein: 5.8 g/dL — ABNORMAL LOW (ref 6.4–8.2)

## 2012-11-21 LAB — CBC WITH DIFFERENTIAL/PLATELET
Eosinophil: 2 %
HGB: 9.2 g/dL — ABNORMAL LOW (ref 12.0–16.0)
Lymphocytes: 12 %
MCH: 29.9 pg (ref 26.0–34.0)
Metamyelocyte: 2 %
Myelocyte: 2 %
NRBC/100 WBC: 51 /
Platelet: 17 10*3/uL — CL (ref 150–440)
RBC: 3.06 10*6/uL — ABNORMAL LOW (ref 3.80–5.20)

## 2012-11-22 LAB — CBC WITH DIFFERENTIAL/PLATELET
HGB: 9.4 g/dL — ABNORMAL LOW (ref 12.0–16.0)
MCH: 30.5 pg (ref 26.0–34.0)
MCV: 89 fL (ref 80–100)
Myelocyte: 11 %
NRBC/100 WBC: 18 /
Platelet: 38 10*3/uL — ABNORMAL LOW (ref 150–440)
RBC: 3.08 10*6/uL — ABNORMAL LOW (ref 3.80–5.20)
RDW: 19.1 % — ABNORMAL HIGH (ref 11.5–14.5)
Segmented Neutrophils: 39 %
WBC: 34.9 10*3/uL — ABNORMAL HIGH (ref 3.6–11.0)

## 2012-11-25 LAB — CBC CANCER CENTER
Basophil: 1 %
Blast: 2 %
Eosinophil: 1 %
HCT: 28.7 % — ABNORMAL LOW (ref 35.0–47.0)
HGB: 9.7 g/dL — ABNORMAL LOW (ref 12.0–16.0)
Lymphocytes: 13 %
MCH: 30.8 pg (ref 26.0–34.0)
MCHC: 33.9 g/dL (ref 32.0–36.0)
MCV: 91 fL (ref 80–100)
NRBC/100 WBC: 7 /100
RDW: 18.5 % — ABNORMAL HIGH (ref 11.5–14.5)
Segmented Neutrophils: 54 %
WBC: 39.3 x10 3/mm — ABNORMAL HIGH (ref 3.6–11.0)

## 2012-11-25 LAB — COMPREHENSIVE METABOLIC PANEL
Anion Gap: 11 (ref 7–16)
BUN: 16 mg/dL (ref 7–18)
Bilirubin,Total: 1.8 mg/dL — ABNORMAL HIGH (ref 0.2–1.0)
Chloride: 95 mmol/L — ABNORMAL LOW (ref 98–107)
Co2: 24 mmol/L (ref 21–32)
Creatinine: 0.75 mg/dL (ref 0.60–1.30)
EGFR (African American): >60
EGFR (Non-African Amer.): >60
Glucose: 227 mg/dL — ABNORMAL HIGH (ref 65–99)
Potassium: 3.9 mmol/L (ref 3.5–5.1)
Sodium: 130 mmol/L — ABNORMAL LOW (ref 136–145)
Total Protein: 6.6 g/dL (ref 6.4–8.2)

## 2012-12-02 LAB — GLUCOSE, RANDOM: Glucose: 211 mg/dL — ABNORMAL HIGH (ref 65–99)

## 2012-12-02 LAB — CBC CANCER CENTER
Bands: 18 %
Eosinophil: 1 %
HGB: 7.7 g/dL — ABNORMAL LOW (ref 12.0–16.0)
MCH: 31.1 pg (ref 26.0–34.0)
MCV: 96 fL (ref 80–100)
NRBC/100 WBC: 19 /100
Platelet: 62 x10 3/mm — ABNORMAL LOW (ref 150–440)
RBC: 2.47 10*6/uL — ABNORMAL LOW (ref 3.80–5.20)
RDW: 22.8 % — ABNORMAL HIGH (ref 11.5–14.5)
Segmented Neutrophils: 39 %
WBC: 74.1 x10 3/mm — ABNORMAL HIGH (ref 3.6–11.0)

## 2012-12-02 LAB — URINALYSIS, COMPLETE
Glucose,UR: NEGATIVE mg/dL (ref 0–75)
Ketone: NEGATIVE
Ph: 6 (ref 4.5–8.0)
RBC,UR: <1 /HPF (ref 0–5)
Specific Gravity: 1.012 (ref 1.003–1.030)
WBC UR: 57 /HPF (ref 0–5)

## 2012-12-09 LAB — CBC CANCER CENTER
Eosinophil: 2 %
HCT: 27.5 % — ABNORMAL LOW (ref 35.0–47.0)
HGB: 8.8 g/dL — ABNORMAL LOW (ref 12.0–16.0)
Lymphocytes: 3 %
MCH: 30 pg (ref 26.0–34.0)
MCHC: 32 g/dL (ref 32.0–36.0)
Metamyelocyte: 10 %
Monocytes: 3 %
Myelocyte: 15 %
Platelet: 68 x10 3/mm — ABNORMAL LOW (ref 150–440)
RDW: 21.7 % — ABNORMAL HIGH (ref 11.5–14.5)
Segmented Neutrophils: 33 %
WBC: 84.6 x10 3/mm — ABNORMAL HIGH (ref 3.6–11.0)

## 2012-12-14 ENCOUNTER — Ambulatory Visit: Payer: Self-pay | Admitting: Oncology

## 2012-12-16 LAB — COMPREHENSIVE METABOLIC PANEL
Albumin: 3.5 g/dL (ref 3.4–5.0)
Alkaline Phosphatase: 83 U/L (ref 50–136)
Anion Gap: 10 (ref 7–16)
Bilirubin,Total: 1.4 mg/dL — ABNORMAL HIGH (ref 0.2–1.0)
Calcium, Total: 9 mg/dL (ref 8.5–10.1)
Co2: 27 mmol/L (ref 21–32)
Creatinine: 0.94 mg/dL (ref 0.60–1.30)
EGFR (African American): >60
Glucose: 138 mg/dL — ABNORMAL HIGH (ref 65–99)
Osmolality: 263 (ref 275–301)
Potassium: 4.5 mmol/L (ref 3.5–5.1)
SGOT(AST): 40 U/L — ABNORMAL HIGH (ref 15–37)
SGPT (ALT): 20 U/L (ref 12–78)
Sodium: 130 mmol/L — ABNORMAL LOW (ref 136–145)

## 2012-12-16 LAB — CBC CANCER CENTER
Blast: 5 %
HGB: 6.8 g/dL — ABNORMAL LOW (ref 12.0–16.0)
Lymphocytes: 6 %
MCV: 92 fL (ref 80–100)
Promyelocyte: 5 %
RBC: 2.28 10*6/uL — ABNORMAL LOW (ref 3.80–5.20)
RDW: 19.4 % — ABNORMAL HIGH (ref 11.5–14.5)
Segmented Neutrophils: 35 %

## 2012-12-17 LAB — CLOSTRIDIUM DIFFICILE BY PCR

## 2012-12-23 LAB — CBC CANCER CENTER
Bands: 11 %
Basophil: 5 %
Blast: 1 %
Eosinophil: 2 %
HCT: 25.7 % — ABNORMAL LOW (ref 35.0–47.0)
HGB: 8.4 g/dL — ABNORMAL LOW (ref 12.0–16.0)
MCH: 30.9 pg (ref 26.0–34.0)
MCV: 94 fL (ref 80–100)
Metamyelocyte: 8 %
Monocytes: 5 %
Myelocyte: 10 %
NRBC/100 WBC: 16 /100
Platelet: 20 x10 3/mm — CL (ref 150–440)
RBC: 2.73 10*6/uL — ABNORMAL LOW (ref 3.80–5.20)
RDW: 20.4 % — ABNORMAL HIGH (ref 11.5–14.5)
Segmented Neutrophils: 51 %
WBC: 35.8 x10 3/mm — ABNORMAL HIGH (ref 3.6–11.0)

## 2012-12-27 LAB — CBC CANCER CENTER
Basophil: 5 %
Lymphocytes: 6 %
MCV: 94 fL (ref 80–100)
Monocytes: 3 %
NRBC/100 WBC: 32 /100
Platelet: 13 x10 3/mm — CL (ref 150–440)
RBC: 2.32 10*6/uL — ABNORMAL LOW (ref 3.80–5.20)
Segmented Neutrophils: 47 %
WBC: 26.1 x10 3/mm — ABNORMAL HIGH (ref 3.6–11.0)

## 2012-12-30 LAB — CBC CANCER CENTER
Bands: 9 %
Basophil: 16 %
Blast: 8 %
Eosinophil: 4 %
HCT: 22.8 % — ABNORMAL LOW (ref 35.0–47.0)
HGB: 7.6 g/dL — ABNORMAL LOW (ref 12.0–16.0)
Lymphocytes: 8 %
MCH: 31.1 pg (ref 26.0–34.0)
MCHC: 33.5 g/dL (ref 32.0–36.0)
MCV: 93 fL (ref 80–100)
Metamyelocyte: 4 %
Monocytes: 2 %
Myelocyte: 9 %
NRBC/100 WBC: 30 /100
Platelet: 29 x10 3/mm — CL (ref 150–440)
Promyelocyte: 7 %
RBC: 2.45 10*6/uL — ABNORMAL LOW (ref 3.80–5.20)
RDW: 19.8 % — ABNORMAL HIGH (ref 11.5–14.5)
Segmented Neutrophils: 33 %
WBC: 25.7 x10 3/mm — ABNORMAL HIGH (ref 3.6–11.0)

## 2013-01-02 LAB — CBC CANCER CENTER
Basophil: 21 %
Blast: 6 %
Eosinophil: 5 %
HCT: 20.9 % — ABNORMAL LOW (ref 35.0–47.0)
HGB: 6.8 g/dL — ABNORMAL LOW (ref 12.0–16.0)
Lymphocytes: 2 %
MCH: 30.8 pg (ref 26.0–34.0)
Metamyelocyte: 6 %
Monocytes: 5 %
Myelocyte: 11 %
Promyelocyte: 1 %
RDW: 21 % — ABNORMAL HIGH (ref 11.5–14.5)
Segmented Neutrophils: 32 %
WBC: 42.6 x10 3/mm — ABNORMAL HIGH (ref 3.6–11.0)

## 2013-01-03 LAB — RAPID INFLUENZA A&B ANTIGENS

## 2013-01-04 ENCOUNTER — Inpatient Hospital Stay: Payer: Self-pay | Admitting: Internal Medicine

## 2013-01-04 LAB — COMPREHENSIVE METABOLIC PANEL
Albumin: 3.3 g/dL — ABNORMAL LOW (ref 3.4–5.0)
Alkaline Phosphatase: 91 U/L (ref 50–136)
Anion Gap: 8 (ref 7–16)
BUN: 12 mg/dL (ref 7–18)
Calcium, Total: 8.5 mg/dL (ref 8.5–10.1)
Chloride: 97 mmol/L — ABNORMAL LOW (ref 98–107)
Co2: 23 mmol/L (ref 21–32)
Creatinine: 0.68 mg/dL (ref 0.60–1.30)
EGFR (African American): >60
EGFR (Non-African Amer.): >60
Glucose: 182 mg/dL — ABNORMAL HIGH (ref 65–99)
Osmolality: 261 (ref 275–301)
SGOT(AST): 68 U/L — ABNORMAL HIGH (ref 15–37)
Total Protein: 6.4 g/dL (ref 6.4–8.2)

## 2013-01-04 LAB — CBC
HCT: 28.2 % — ABNORMAL LOW (ref 35.0–47.0)
HGB: 9.2 g/dL — ABNORMAL LOW (ref 12.0–16.0)
MCHC: 32.7 g/dL (ref 32.0–36.0)
RBC: 3.1 10*6/uL — ABNORMAL LOW (ref 3.80–5.20)
RDW: 19.8 % — ABNORMAL HIGH (ref 11.5–14.5)
WBC: 49.2 10*3/uL — ABNORMAL HIGH (ref 3.6–11.0)

## 2013-01-04 LAB — URINALYSIS, COMPLETE
Bacteria: NONE SEEN
Ketone: NEGATIVE
Protein: NEGATIVE
RBC,UR: NONE SEEN /HPF (ref 0–5)
Specific Gravity: 1.017 (ref 1.003–1.030)

## 2013-01-04 LAB — APTT: Activated PTT: 39 secs — ABNORMAL HIGH (ref 23.6–35.9)

## 2013-01-04 LAB — CK TOTAL AND CKMB (NOT AT ARMC): CK-MB: <0.5 ng/mL — ABNORMAL LOW (ref 0.5–3.6)

## 2013-01-04 LAB — PROTIME-INR: INR: 1.3

## 2013-01-05 LAB — DIFFERENTIAL
Bands: 6 %
Blast: 2 %
Eosinophil: 8 %
Lymphocytes: 9 %
Metamyelocyte: 10 %
Promyelocyte: 4 %
Segmented Neutrophils: 43 %
Variant Lymphocyte - H1-Rlymph: 1 %

## 2013-01-05 LAB — CBC WITH DIFFERENTIAL/PLATELET
Bands: 7 %
Basophil: 10 %
Blast: 5 %
Eosinophil: 5 %
HCT: 23.4 % — ABNORMAL LOW (ref 35.0–47.0)
Lymphocytes: 7 %
Monocytes: 9 %
Myelocyte: 8 %
NRBC/100 WBC: 58 /
Platelet: 52 10*3/uL — ABNORMAL LOW (ref 150–440)
Promyelocyte: 10 %
RBC: 2.63 10*6/uL — ABNORMAL LOW (ref 3.80–5.20)
Segmented Neutrophils: 37 %

## 2013-01-05 LAB — COMPREHENSIVE METABOLIC PANEL
Creatinine: 0.77 mg/dL (ref 0.60–1.30)
EGFR (African American): >60
EGFR (Non-African Amer.): >60
Osmolality: 265 (ref 275–301)
SGOT(AST): 44 U/L — ABNORMAL HIGH (ref 15–37)
Sodium: 129 mmol/L — ABNORMAL LOW (ref 136–145)
Total Protein: 5.8 g/dL — ABNORMAL LOW (ref 6.4–8.2)

## 2013-01-05 LAB — URINE CULTURE

## 2013-01-06 LAB — COMPREHENSIVE METABOLIC PANEL
Albumin: 3.1 g/dL — ABNORMAL LOW (ref 3.4–5.0)
Alkaline Phosphatase: 86 U/L (ref 50–136)
Anion Gap: 14 (ref 7–16)
Chloride: 91 mmol/L — ABNORMAL LOW (ref 98–107)
Co2: 17 mmol/L — ABNORMAL LOW (ref 21–32)
Glucose: 199 mg/dL — ABNORMAL HIGH (ref 65–99)
Osmolality: 258 (ref 275–301)
Potassium: 4.9 mmol/L (ref 3.5–5.1)
Sodium: 122 mmol/L — ABNORMAL LOW (ref 136–145)

## 2013-01-06 LAB — CBC WITH DIFFERENTIAL/PLATELET
Bands: 4 %
Basophil: 2 %
HCT: 21.7 % — ABNORMAL LOW (ref 35.0–47.0)
HGB: 7.2 g/dL — ABNORMAL LOW (ref 12.0–16.0)
MCH: 29.9 pg (ref 26.0–34.0)
MCHC: 33.1 g/dL (ref 32.0–36.0)
MCV: 90 fL (ref 80–100)
Metamyelocyte: 9 %
Myelocyte: 7 %
Promyelocyte: 7 %
RBC: 2.41 10*6/uL — ABNORMAL LOW (ref 3.80–5.20)
Segmented Neutrophils: 49 %
WBC: 64.7 10*3/uL — ABNORMAL HIGH (ref 3.6–11.0)

## 2013-01-07 LAB — CBC WITH DIFFERENTIAL/PLATELET
Basophil: 3 %
Blast: 1 %
Eosinophil: 10 %
HGB: 7.2 g/dL — ABNORMAL LOW (ref 12.0–16.0)
Lymphocytes: 9 %
MCHC: 34.5 g/dL (ref 32.0–36.0)
MCV: 88 fL (ref 80–100)
Monocytes: 1 %
NRBC/100 WBC: 46 /
Platelet: 33 10*3/uL — ABNORMAL LOW (ref 150–440)
RBC: 2.38 10*6/uL — ABNORMAL LOW (ref 3.80–5.20)
RDW: 19.3 % — ABNORMAL HIGH (ref 11.5–14.5)
Segmented Neutrophils: 62 %

## 2013-01-07 LAB — COMPREHENSIVE METABOLIC PANEL
Albumin: 3 g/dL — ABNORMAL LOW (ref 3.4–5.0)
Alkaline Phosphatase: 95 U/L (ref 50–136)
Bilirubin,Total: 3.5 mg/dL — ABNORMAL HIGH (ref 0.2–1.0)
Calcium, Total: 7.9 mg/dL — ABNORMAL LOW (ref 8.5–10.1)
Creatinine: 0.91 mg/dL (ref 0.60–1.30)
Potassium: 4.3 mmol/L (ref 3.5–5.1)
SGOT(AST): 150 U/L — ABNORMAL HIGH (ref 15–37)
SGPT (ALT): 230 U/L — ABNORMAL HIGH (ref 12–78)
Sodium: 121 mmol/L — ABNORMAL LOW (ref 136–145)

## 2013-01-08 LAB — COMPREHENSIVE METABOLIC PANEL
Albumin: 2.6 g/dL — ABNORMAL LOW (ref 3.4–5.0)
Alkaline Phosphatase: 84 U/L (ref 50–136)
BUN: 21 mg/dL — ABNORMAL HIGH (ref 7–18)
Calcium, Total: 7.3 mg/dL — ABNORMAL LOW (ref 8.5–10.1)
Chloride: 96 mmol/L — ABNORMAL LOW (ref 98–107)
Co2: 22 mmol/L (ref 21–32)
Potassium: 4.1 mmol/L (ref 3.5–5.1)
SGOT(AST): 75 U/L — ABNORMAL HIGH (ref 15–37)
Sodium: 126 mmol/L — ABNORMAL LOW (ref 136–145)

## 2013-01-08 LAB — CBC WITH DIFFERENTIAL/PLATELET
Bands: 11 %
Basophil: 1 %
Comment - H1-Com6: NORMAL
HCT: 20.4 % — ABNORMAL LOW (ref 35.0–47.0)
HGB: 7.2 g/dL — ABNORMAL LOW (ref 12.0–16.0)
Lymphocytes: 14 %
MCHC: 35.3 g/dL (ref 32.0–36.0)
Metamyelocyte: 7 %
Myelocyte: 7 %
NRBC/100 WBC: 14 /
Platelet: 33 10*3/uL — ABNORMAL LOW (ref 150–440)
Promyelocyte: 3 %
WBC: 33.3 10*3/uL — ABNORMAL HIGH (ref 3.6–11.0)

## 2013-01-09 LAB — CULTURE, BLOOD (SINGLE)

## 2013-01-11 ENCOUNTER — Ambulatory Visit: Payer: Self-pay | Admitting: Oncology

## 2013-01-13 LAB — CBC CANCER CENTER
Blast: 4 %
Eosinophil: 5 %
HCT: 21.4 % — ABNORMAL LOW (ref 35.0–47.0)
HGB: 6.8 g/dL — ABNORMAL LOW (ref 12.0–16.0)
MCHC: 31.9 g/dL — ABNORMAL LOW (ref 32.0–36.0)
MCV: 93 fL (ref 80–100)
NRBC/100 WBC: 108 /100
RBC: 2.3 10*6/uL — ABNORMAL LOW (ref 3.80–5.20)
RDW: 20.2 % — ABNORMAL HIGH (ref 11.5–14.5)
Segmented Neutrophils: 50 %

## 2013-01-13 LAB — COMPREHENSIVE METABOLIC PANEL
Albumin: 3 g/dL — ABNORMAL LOW (ref 3.4–5.0)
Alkaline Phosphatase: 98 U/L (ref 50–136)
Anion Gap: 7 (ref 7–16)
BUN: 18 mg/dL (ref 7–18)
Creatinine: 0.78 mg/dL (ref 0.60–1.30)
EGFR (African American): >60
Glucose: 322 mg/dL — ABNORMAL HIGH (ref 65–99)
Osmolality: 262 (ref 275–301)
SGOT(AST): 82 U/L — ABNORMAL HIGH (ref 15–37)
SGPT (ALT): 44 U/L (ref 12–78)

## 2013-01-20 LAB — CBC CANCER CENTER
Bands: 11 %
HCT: 20.4 % — ABNORMAL LOW (ref 35.0–47.0)
MCHC: 33 g/dL (ref 32.0–36.0)
MCV: 87 fL (ref 80–100)
Metamyelocyte: 4 %
Platelet: 37 x10 3/mm — ABNORMAL LOW (ref 150–440)
RBC: 2.34 10*6/uL — ABNORMAL LOW (ref 3.80–5.20)
RDW: 17.5 % — ABNORMAL HIGH (ref 11.5–14.5)
Segmented Neutrophils: 47 %
WBC: 44.3 x10 3/mm — ABNORMAL HIGH (ref 3.6–11.0)

## 2013-01-20 LAB — COMPREHENSIVE METABOLIC PANEL
Anion Gap: 11 (ref 7–16)
Bilirubin,Total: 2.6 mg/dL — ABNORMAL HIGH (ref 0.2–1.0)
Calcium, Total: 8.6 mg/dL (ref 8.5–10.1)
Chloride: 90 mmol/L — ABNORMAL LOW (ref 98–107)
Co2: 22 mmol/L (ref 21–32)
EGFR (Non-African Amer.): 57 — ABNORMAL LOW
Glucose: 263 mg/dL — ABNORMAL HIGH (ref 65–99)
SGOT(AST): 30 U/L (ref 15–37)
SGPT (ALT): 16 U/L (ref 12–78)
Sodium: 123 mmol/L — ABNORMAL LOW (ref 136–145)
Total Protein: 6 g/dL — ABNORMAL LOW (ref 6.4–8.2)

## 2013-01-27 LAB — BASIC METABOLIC PANEL
Anion Gap: 9 (ref 7–16)
BUN: 19 mg/dL — ABNORMAL HIGH (ref 7–18)
Chloride: 90 mmol/L — ABNORMAL LOW (ref 98–107)
Co2: 25 mmol/L (ref 21–32)
Creatinine: 1.01 mg/dL (ref 0.60–1.30)
EGFR (African American): 60 — ABNORMAL LOW
EGFR (Non-African Amer.): 51 — ABNORMAL LOW
Glucose: 148 mg/dL — ABNORMAL HIGH (ref 65–99)
Osmolality: 255 (ref 275–301)
Sodium: 124 mmol/L — ABNORMAL LOW (ref 136–145)

## 2013-01-27 LAB — CBC CANCER CENTER
Basophil: 8 %
Blast: 3 %
Lymphocytes: 16 %
MCH: 29.3 pg (ref 26.0–34.0)
MCHC: 34.6 g/dL (ref 32.0–36.0)
MCV: 85 fL (ref 80–100)
Metamyelocyte: 8 %
Myelocyte: 3 %
NRBC/100 WBC: 28 /100
Promyelocyte: 1 %
Segmented Neutrophils: 44 %
WBC: 40.2 x10 3/mm — ABNORMAL HIGH (ref 3.6–11.0)

## 2013-02-03 LAB — COMPREHENSIVE METABOLIC PANEL
Albumin: 3.1 g/dL — ABNORMAL LOW (ref 3.4–5.0)
Anion Gap: 9 (ref 7–16)
BUN: 23 mg/dL — ABNORMAL HIGH (ref 7–18)
Calcium, Total: 9.3 mg/dL (ref 8.5–10.1)
Chloride: 93 mmol/L — ABNORMAL LOW (ref 98–107)
Co2: 24 mmol/L (ref 21–32)
EGFR (African American): >60
EGFR (Non-African Amer.): 58 — ABNORMAL LOW
Glucose: 116 mg/dL — ABNORMAL HIGH (ref 65–99)
Osmolality: 258 (ref 275–301)
SGPT (ALT): 16 U/L (ref 12–78)

## 2013-02-03 LAB — CBC CANCER CENTER
Bands: 11 %
Basophil: 3 %
HGB: 6.9 g/dL — ABNORMAL LOW (ref 12.0–16.0)
MCH: 30.5 pg (ref 26.0–34.0)
MCHC: 33.5 g/dL (ref 32.0–36.0)
MCV: 91 fL (ref 80–100)
Monocytes: 7 %
NRBC/100 WBC: 22 /100
Platelet: 24 x10 3/mm — CL (ref 150–440)
RDW: 20.9 % — ABNORMAL HIGH (ref 11.5–14.5)
WBC: 81.6 x10 3/mm — ABNORMAL HIGH (ref 3.6–11.0)

## 2013-02-11 ENCOUNTER — Ambulatory Visit: Payer: Self-pay | Admitting: Oncology

## 2013-02-22 IMAGING — CR DG CHEST 1V
1 series · 1 of 1 positions shown · non-contrast
Comparison: none

REASON FOR EXAM: shortness of breath
COMMENTS:

[pa]
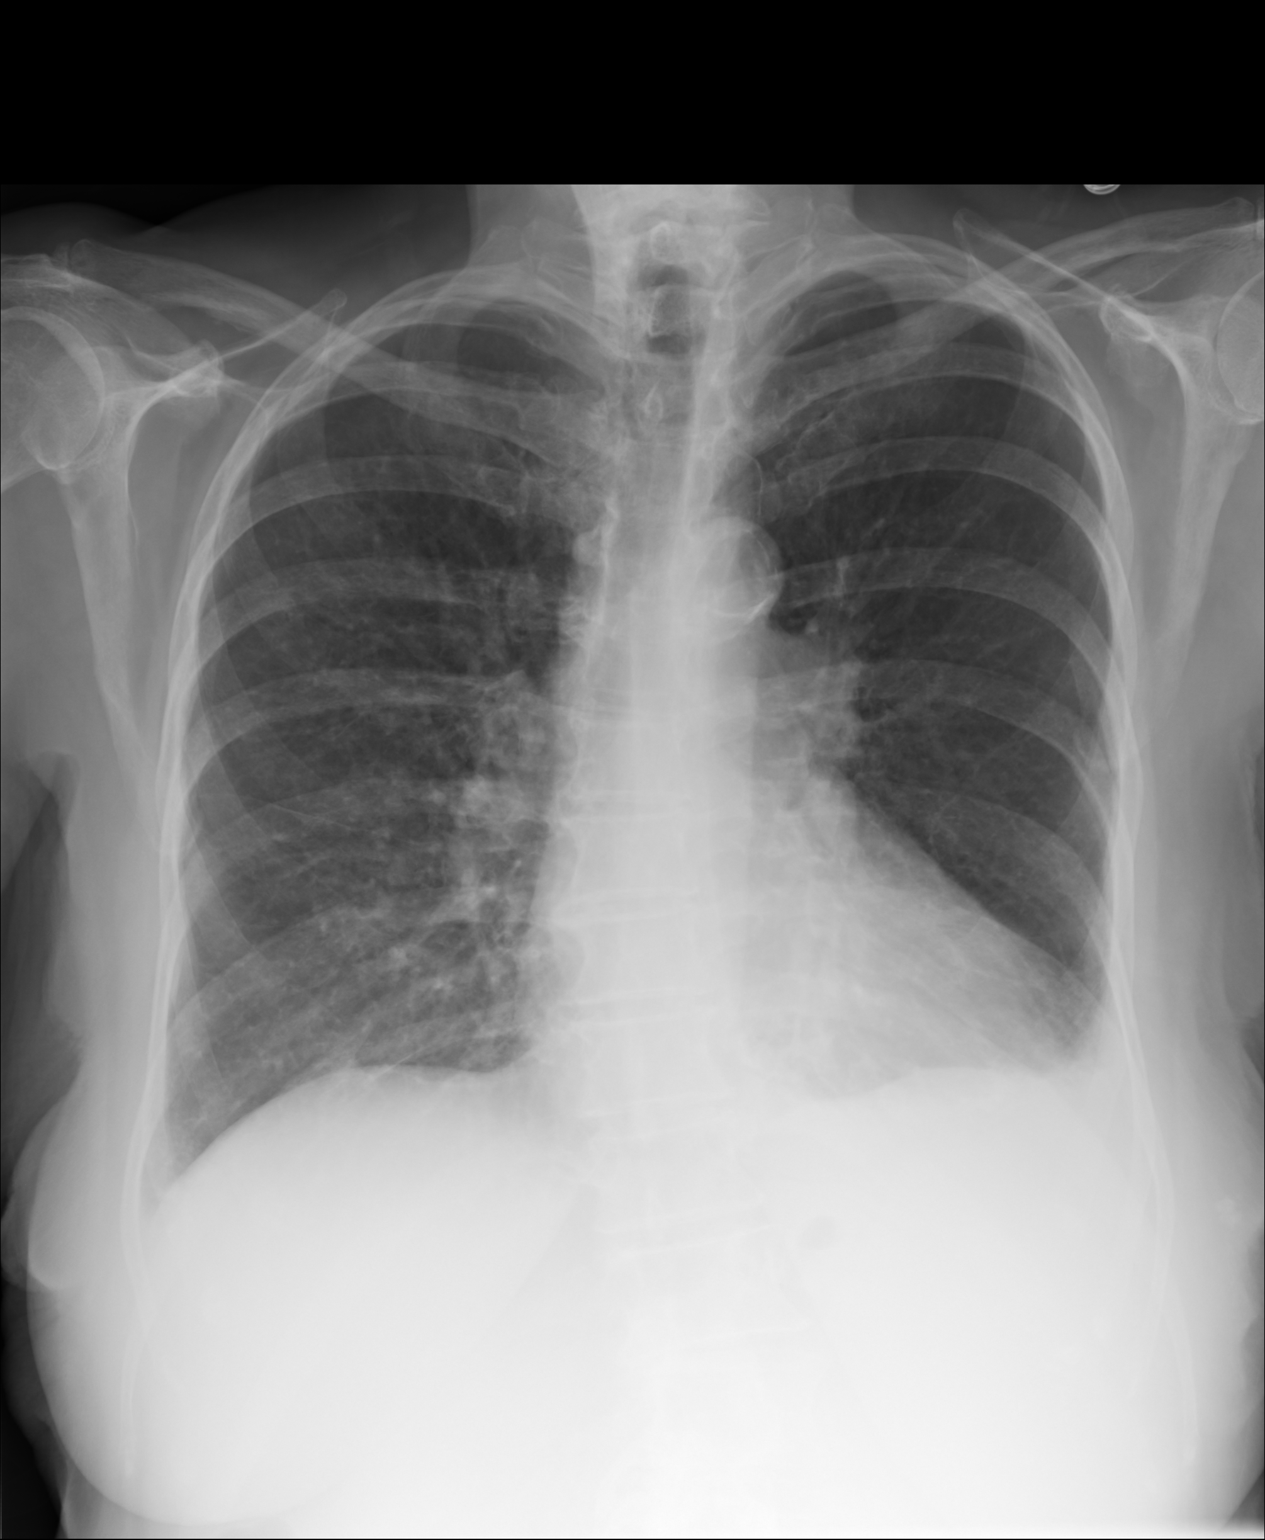

[1 of 1 positions shown; findings below may reference images not displayed]

PROCEDURE:     DXR - DXR CHEST 1 VIEWAP OR PA  - November 19, 2012 [DATE]

RESULT:     Comparison is made to previous exam dated 11 August, 2011.
There is blunting of the left costophrenic angle suggestive of small amount
of pleural effusion. Atherosclerotic calcification is seen within the aortic
arch. The lungs are clear. The bony and mediastinal structures are
unremarkable. The cardiac silhouette appears borderline enlarged.
IMPRESSION: 1. Small left pleural effusion. Atherosclerotic disease. Hyperinflation
consistent with COPD.

[REDACTED]

## 2013-02-24 LAB — COMPREHENSIVE METABOLIC PANEL
Albumin: 3.4 g/dL (ref 3.4–5.0)
Alkaline Phosphatase: 116 U/L (ref 50–136)
Anion Gap: 14 (ref 7–16)
BUN: 17 mg/dL (ref 7–18)
Chloride: 94 mmol/L — ABNORMAL LOW (ref 98–107)
Co2: 21 mmol/L (ref 21–32)
Creatinine: 0.95 mg/dL (ref 0.60–1.30)
EGFR (Non-African Amer.): 55 — ABNORMAL LOW
Glucose: 124 mg/dL — ABNORMAL HIGH (ref 65–99)
Potassium: 4.1 mmol/L (ref 3.5–5.1)
SGOT(AST): 48 U/L — ABNORMAL HIGH (ref 15–37)
Total Protein: 6.4 g/dL (ref 6.4–8.2)

## 2013-02-24 LAB — CBC CANCER CENTER
Basophil: 8 %
Blast: 6 %
HGB: 6.3 g/dL — ABNORMAL LOW (ref 12.0–16.0)
MCH: 31.7 pg (ref 26.0–34.0)
MCHC: 29.9 g/dL — ABNORMAL LOW (ref 32.0–36.0)
MCV: 106 fL — ABNORMAL HIGH (ref 80–100)
Metamyelocyte: 11 %
Platelet: 52 x10 3/mm — ABNORMAL LOW (ref 150–440)
Promyelocyte: 3 %
RBC: 1.98 10*6/uL — ABNORMAL LOW (ref 3.80–5.20)
RDW: 30.3 % — ABNORMAL HIGH (ref 11.5–14.5)
Segmented Neutrophils: 28 %

## 2013-03-03 LAB — CBC CANCER CENTER
Bands: 10 %
HCT: 23.5 % — ABNORMAL LOW (ref 35.0–47.0)
HGB: 7.6 g/dL — ABNORMAL LOW (ref 12.0–16.0)
MCH: 31.6 pg (ref 26.0–34.0)
MCHC: 32.6 g/dL (ref 32.0–36.0)
MCV: 97 fL (ref 80–100)
Metamyelocyte: 4 %
Monocytes: 6 %
Myelocyte: 12 %
Other Cells Blood: 5 %
Platelet: 33 x10 3/mm — ABNORMAL LOW (ref 150–440)
Promyelocyte: 8 %
RBC: 2.42 10*6/uL — ABNORMAL LOW (ref 3.80–5.20)
Segmented Neutrophils: 38 %

## 2013-03-03 LAB — COMPREHENSIVE METABOLIC PANEL
Alkaline Phosphatase: 104 U/L (ref 50–136)
Bilirubin,Total: 1.8 mg/dL — ABNORMAL HIGH (ref 0.2–1.0)
Co2: 22 mmol/L (ref 21–32)
Glucose: 156 mg/dL — ABNORMAL HIGH (ref 65–99)
Osmolality: 277 (ref 275–301)
Potassium: 4.3 mmol/L (ref 3.5–5.1)
SGPT (ALT): 16 U/L (ref 12–78)
Total Protein: 6.3 g/dL — ABNORMAL LOW (ref 6.4–8.2)

## 2013-03-10 LAB — CBC CANCER CENTER
Blast: 9 %
Eosinophil: 2 %
HGB: 6.4 g/dL — ABNORMAL LOW (ref 12.0–16.0)
MCHC: 31.3 g/dL — ABNORMAL LOW (ref 32.0–36.0)
Metamyelocyte: 10 %
Monocytes: 11 %
RBC: 2.03 10*6/uL — ABNORMAL LOW (ref 3.80–5.20)
RDW: 28.6 % — ABNORMAL HIGH (ref 11.5–14.5)

## 2013-03-13 ENCOUNTER — Ambulatory Visit: Payer: Self-pay | Admitting: Oncology

## 2013-03-14 LAB — URINE CULTURE

## 2013-03-18 LAB — CBC CANCER CENTER
Basophil: 1 %
Blast: 2 %
Eosinophil: 2 %
HCT: 24.6 % — ABNORMAL LOW (ref 35.0–47.0)
Lymphocytes: 17 %
MCH: 31.8 pg (ref 26.0–34.0)
MCHC: 31.7 g/dL — ABNORMAL LOW (ref 32.0–36.0)
Metamyelocyte: 8 %
Monocytes: 3 %
Promyelocyte: 1 %
RBC: 2.45 10*6/uL — ABNORMAL LOW (ref 3.80–5.20)
RDW: 26 % — ABNORMAL HIGH (ref 11.5–14.5)
Segmented Neutrophils: 47 %
WBC: 72.1 x10 3/mm — ABNORMAL HIGH (ref 3.6–11.0)

## 2013-03-18 LAB — COMPREHENSIVE METABOLIC PANEL
Anion Gap: 13 (ref 7–16)
BUN: 23 mg/dL — ABNORMAL HIGH (ref 7–18)
Chloride: 98 mmol/L (ref 98–107)
Creatinine: 0.98 mg/dL (ref 0.60–1.30)
EGFR (African American): >60
EGFR (Non-African Amer.): 53 — ABNORMAL LOW
Potassium: 5.1 mmol/L (ref 3.5–5.1)
Sodium: 133 mmol/L — ABNORMAL LOW (ref 136–145)
Total Protein: 6.7 g/dL (ref 6.4–8.2)

## 2013-03-26 LAB — COMPREHENSIVE METABOLIC PANEL
Albumin: 3.4 g/dL (ref 3.4–5.0)
Alkaline Phosphatase: 88 U/L (ref 50–136)
BUN: 16 mg/dL (ref 7–18)
Calcium, Total: 8.6 mg/dL (ref 8.5–10.1)
Chloride: 97 mmol/L — ABNORMAL LOW (ref 98–107)
Co2: 21 mmol/L (ref 21–32)
EGFR (Non-African Amer.): 58 — ABNORMAL LOW
Glucose: 180 mg/dL — ABNORMAL HIGH (ref 65–99)
Osmolality: 270 (ref 275–301)
SGOT(AST): 35 U/L (ref 15–37)
Total Protein: 6.3 g/dL — ABNORMAL LOW (ref 6.4–8.2)

## 2013-03-26 LAB — CBC CANCER CENTER
Bands: 8 %
Basophil: 12 %
Eosinophil: 4 %
HGB: 6.1 g/dL — ABNORMAL LOW (ref 12.0–16.0)
Lymphocytes: 8 %
MCH: 32.1 pg (ref 26.0–34.0)
MCHC: 29.9 g/dL — ABNORMAL LOW (ref 32.0–36.0)
Metamyelocyte: 6 %
NRBC/100 WBC: 34 /100
Promyelocyte: 4 %
RBC: 1.89 10*6/uL — ABNORMAL LOW (ref 3.80–5.20)
WBC: 97.3 x10 3/mm — ABNORMAL HIGH (ref 3.6–11.0)

## 2013-04-02 LAB — CBC CANCER CENTER
Bands: 9 %
Blast: 3 %
Eosinophil: 4 %
HCT: 25.3 % — ABNORMAL LOW (ref 35.0–47.0)
HGB: 8.3 g/dL — ABNORMAL LOW (ref 12.0–16.0)
Lymphocytes: 4 %
MCHC: 32.7 g/dL (ref 32.0–36.0)
MCV: 102 fL — ABNORMAL HIGH (ref 80–100)
Metamyelocyte: 3 %
Monocytes: 10 %
NRBC/100 WBC: 6 /100
RBC: 2.48 10*6/uL — ABNORMAL LOW (ref 3.80–5.20)
WBC: 52.2 x10 3/mm — ABNORMAL HIGH (ref 3.6–11.0)

## 2013-04-09 LAB — CBC CANCER CENTER
Bands: 10 %
Basophil: 16 %
Eosinophil: 3 %
HGB: 6.5 g/dL — ABNORMAL LOW (ref 12.0–16.0)
Lymphocytes: 3 %
MCH: 33.6 pg (ref 26.0–34.0)
MCHC: 31.6 g/dL — ABNORMAL LOW (ref 32.0–36.0)
Metamyelocyte: 8 %
Myelocyte: 8 %
NRBC/100 WBC: 37 /100
Promyelocyte: 3 %
RBC: 1.95 10*6/uL — ABNORMAL LOW (ref 3.80–5.20)
Segmented Neutrophils: 32 %

## 2013-04-09 IMAGING — CT CT CHEST W/ CM
1 series · 14 of 33 positions shown, 18 images · non-contrast
Comparison: none

REASON FOR EXAM: sob post transfusion
COMMENTS:

[Series 4: soft tissue · axial · 0.63mm/px · z∈[+857,+1115]mm · 14 of 102 slices shown, 18 images]
[im 8/102  mediastinal]
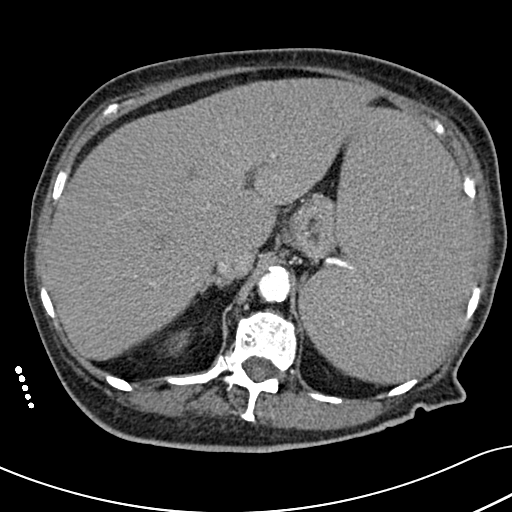
[im 8/102  lung]
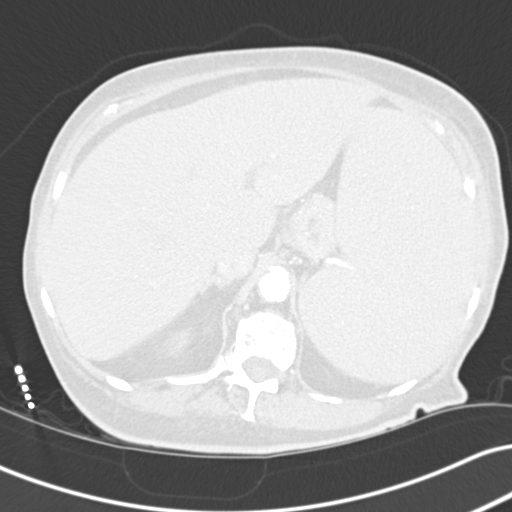
[im 15/102  lung]
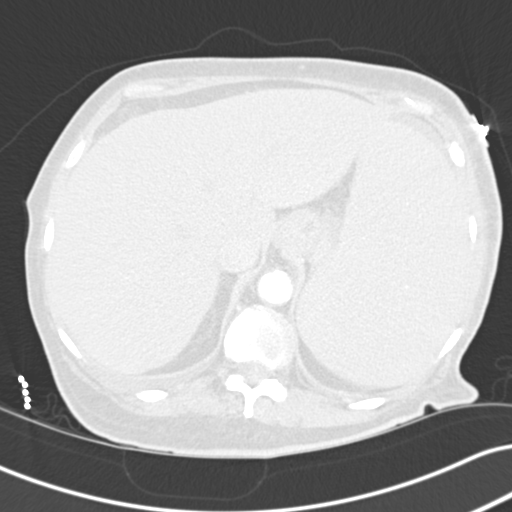
[im 21/102  lung]
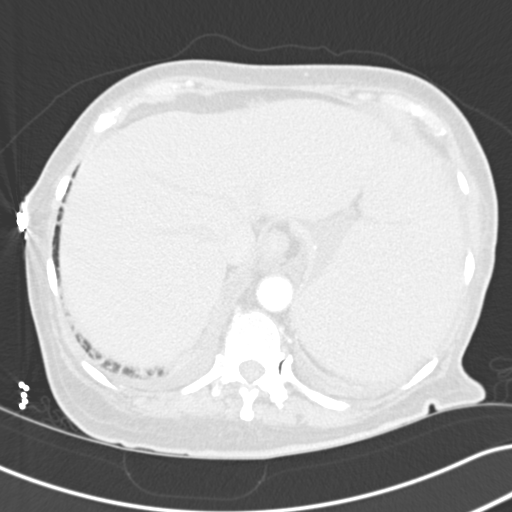
[im 27/102  lung]
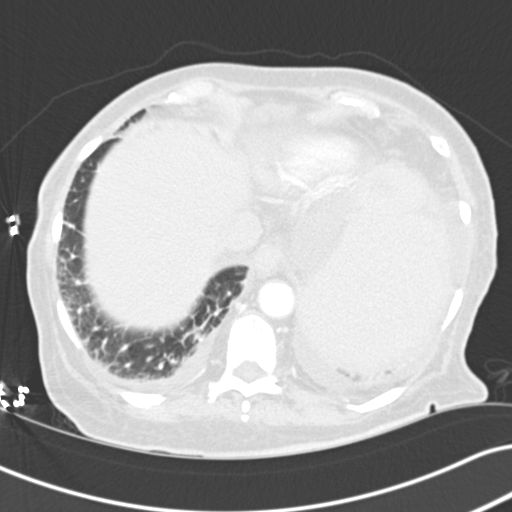
[im 34/102  mediastinal]
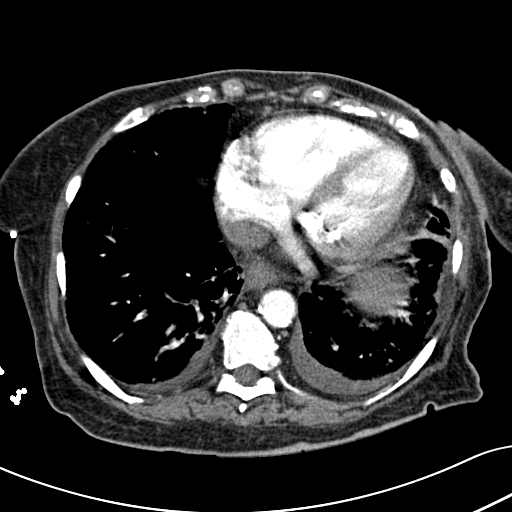
[im 34/102  lung]
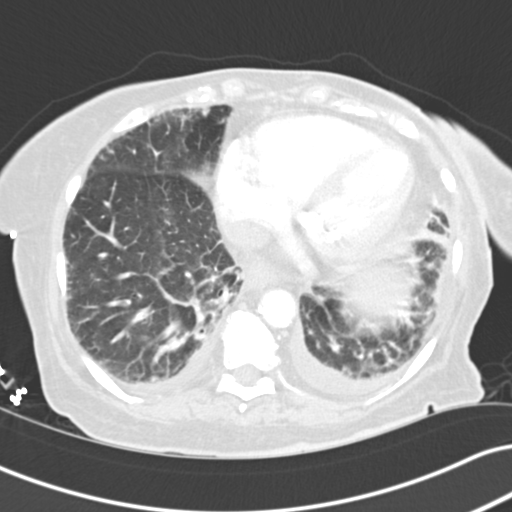
[im 42/102  lung]
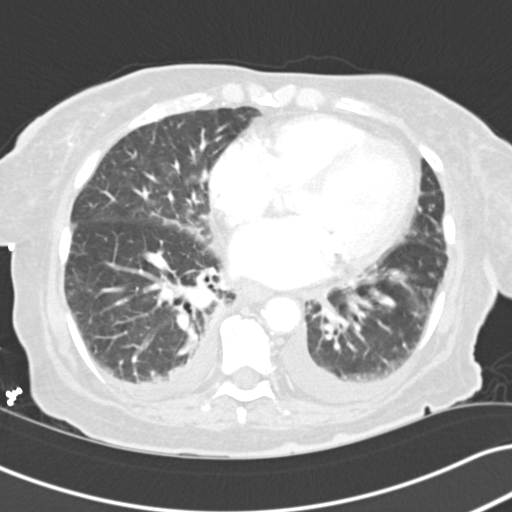
[im 49/102  lung]
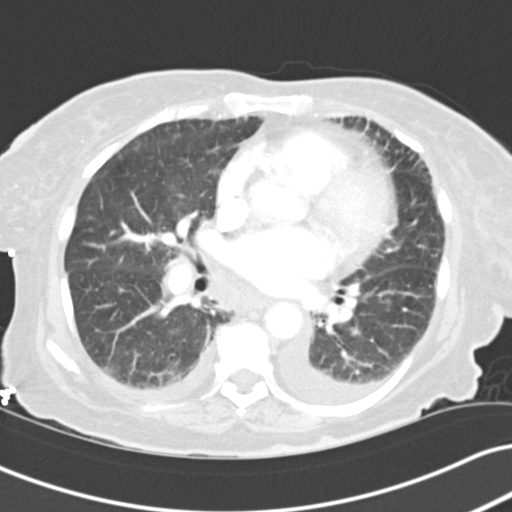
[im 54/102  lung]
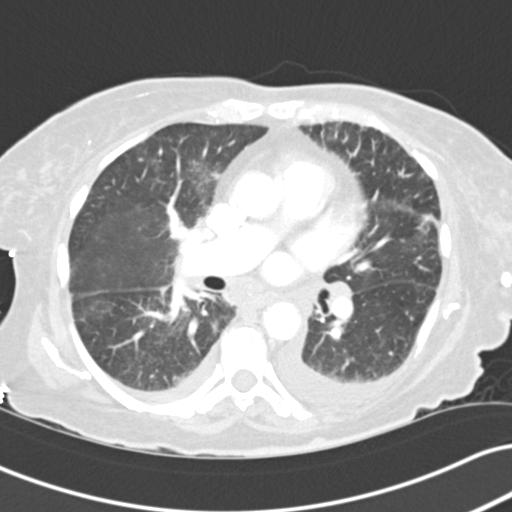
[im 60/102  mediastinal]
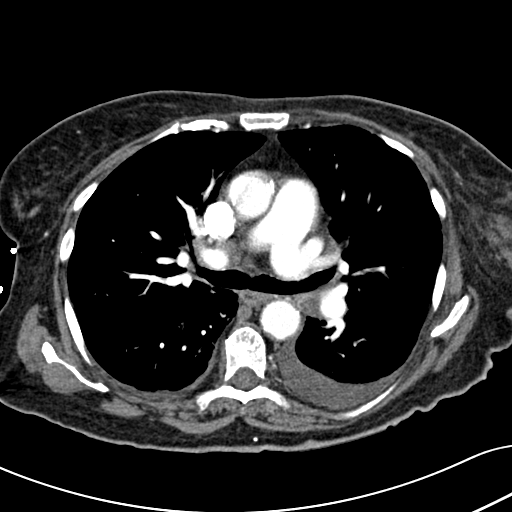
[im 60/102  lung]
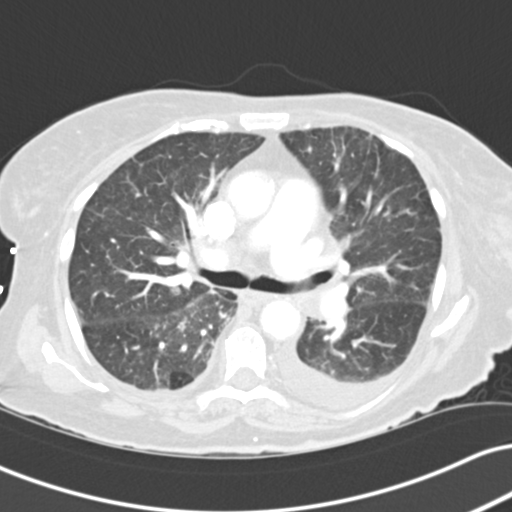
[im 68/102  lung]
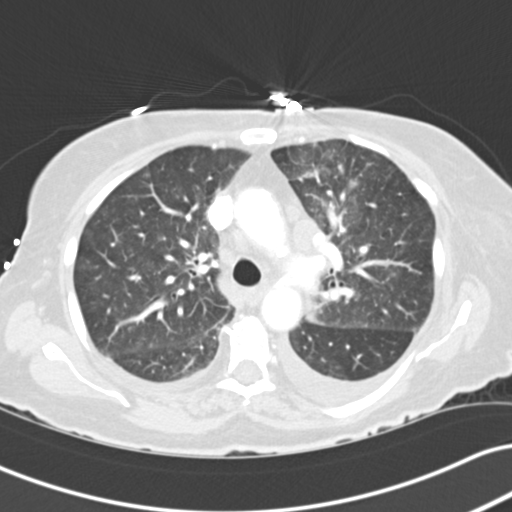
[im 75/102  lung]
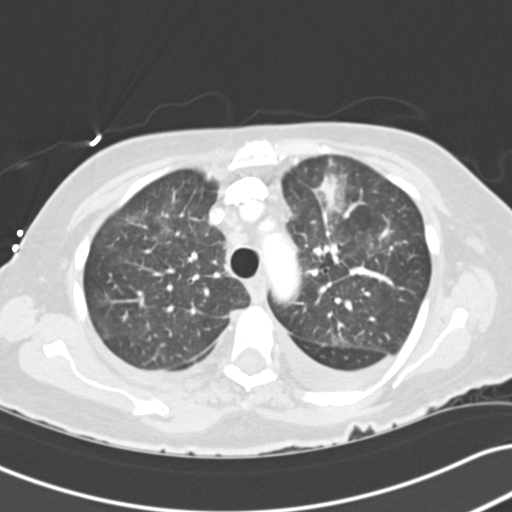
[im 81/102  lung]
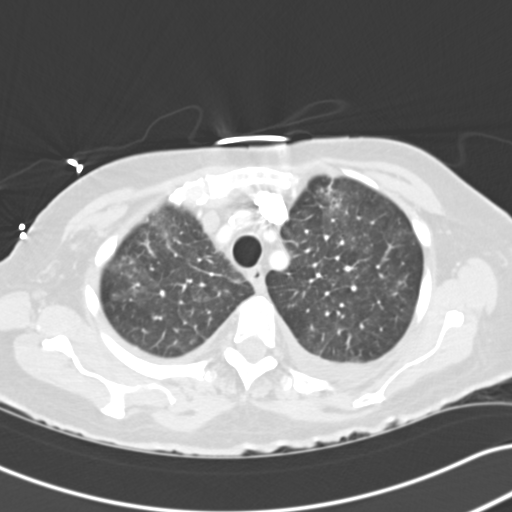
[im 87/102  mediastinal]
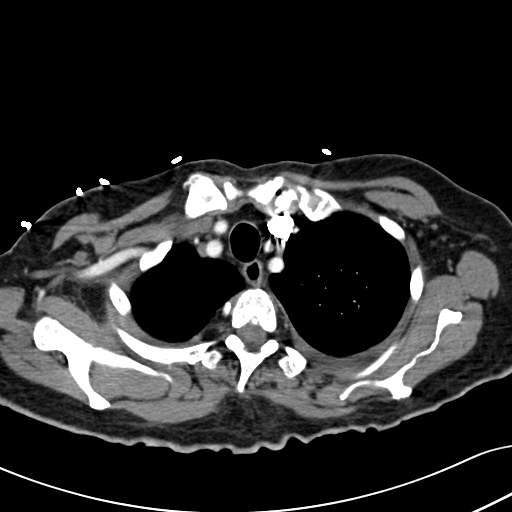
[im 87/102  lung]
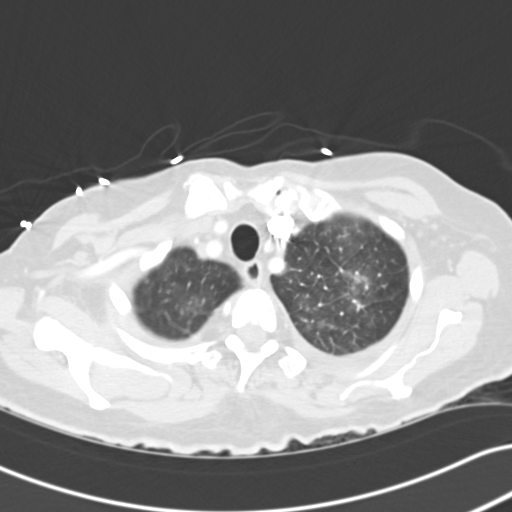
[im 94/102  lung]
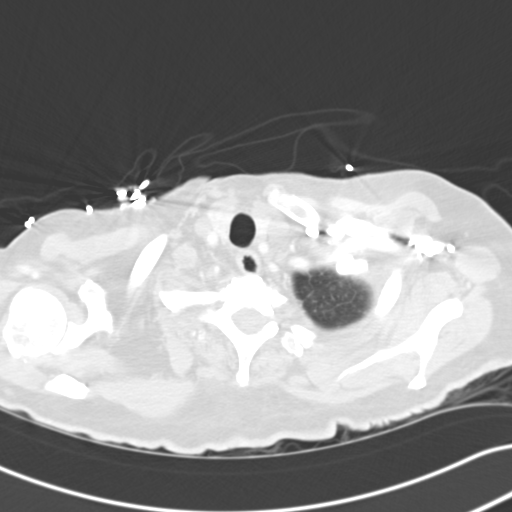

[14 of 33 positions shown; findings below may reference images not displayed]

PROCEDURE:     CT  - CT CHEST (FOR PE) W  - January 04, 2013  [DATE]

RESULT:     Axial CT scanning was performed through the chest with
reconstructions at 3 mm intervals and slice thicknesses following
intravenous administration of 100 cc of Jsovue-KHM. Review of multiplanar
reconstructed images was performed separately on the VIA monitor.

There are small bilateral pleural effusions layering posteriorly. The
cardiac chambers are top normal in size. The caliber of the thoracic aorta
is normal. Contrast within the pulmonary arterial tree is normal. There are
no filling defects to suggest an acute pulmonary embolism. There is bulky
mediastinal lymphadenopathy. There are enlarged hilar lymph nodes on the
right. In the AP window there is a group of nodes with the largest measuring
2 cm in diameter. A pretracheal/ precarinal lymph node measures 2 cm in
greatest dimension. A subcarinal lymph node measures 2.4 cm in greatest
dimension. A right hilar lymph node measures 2.3 cm in greatest dimension.

At lung window settings there are patchy densities present in both upper
lobes. One area is quite confluent in the left upper lobe anteriorly on
images 23 through 35. Coarse interstitial markings are seen elsewhere in
both lungs which are not entirely new.

Within the upper abdomen the spleen is enlarged measuring 18 cm in maximal
AP dimension by 10.5 centimeters in maximal transverse dimension. The
observed portions of the liver appear normal. The adrenal glands are not
included in the field-of-view. The thoracic vertebral bodies are preserved
in height.
IMPRESSION: 1. There is no evidence of an acute pulmonary embolism.
2. There are small bilateral pleural effusions layering posteriorly. The
cardiac chambers are borderline enlarged.
3. Confluent alveolar density present in the left upper lobe is consistent
with pneumonia. There are patchy areas of increased interstitial density
noted elsewhere in the lungs which could reflect pulmonary interstitial
edema.
4. There are bulky mediastinal as well as enlarged right hilar lymph nodes.
5. There is marked splenomegaly. The visualized portions of the liver appear
enlarged. The splenomegaly has increased considerably since gynecoid 13. The
pleural effusions are also new.

A preliminary report was sent to the [HOSPITAL] the conclusion
of the study.

[REDACTED]

## 2013-04-09 IMAGING — CR DG CHEST 2V
1 series · 2 of 2 positions shown · non-contrast
Comparison: none

REASON FOR EXAM: Fever, CP
COMMENTS:

[Series 1: w chest pa · 0.14mm/px · 2 of 2 slices shown]
[im 1/2]
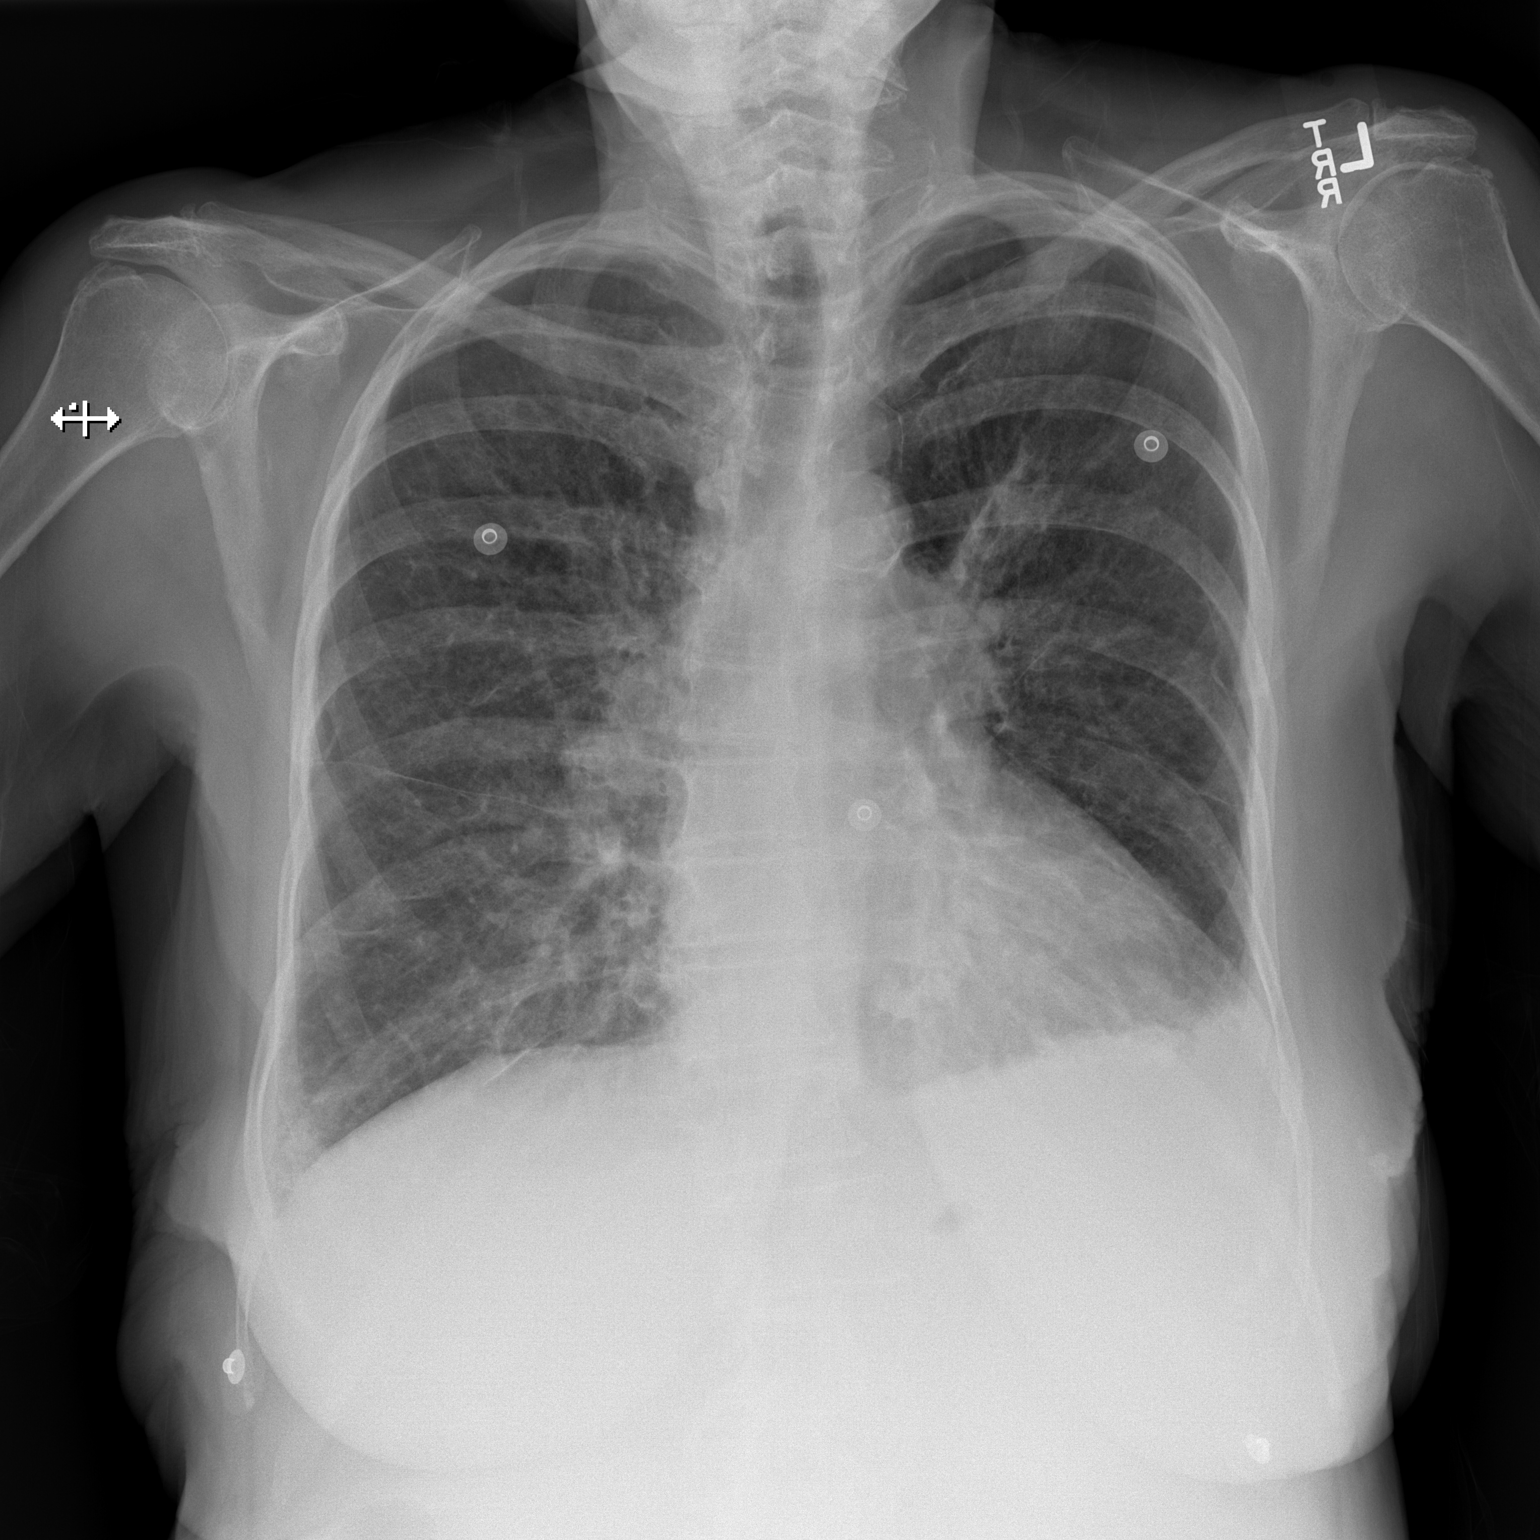
[im 2/2]
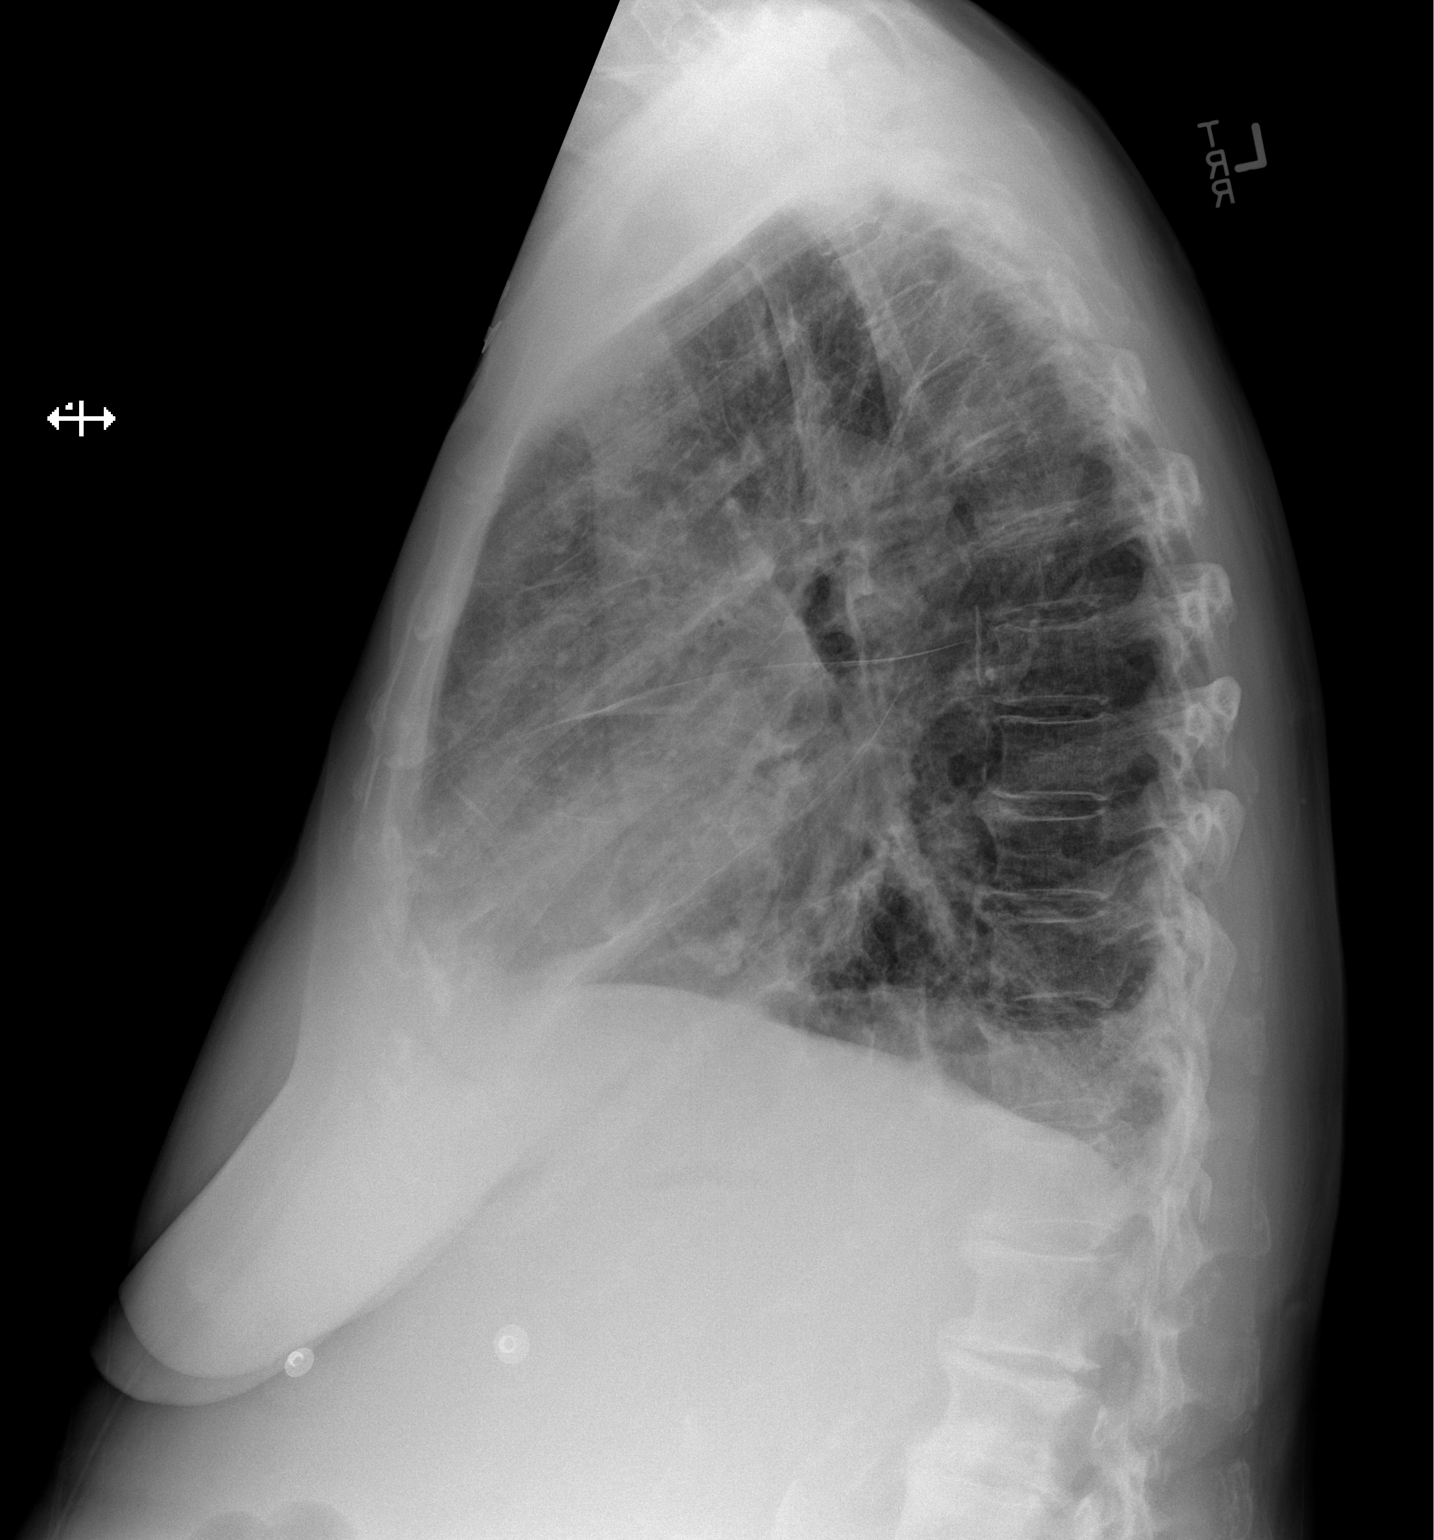

[2 of 2 positions shown; findings below may reference images not displayed]

PROCEDURE:     DXR - DXR CHEST PA (OR AP) AND LATERAL  - January 04, 2013  [DATE]

RESULT:     Comparison is made to study [DATE] 06/[birth year]/1913.

The right lung is well-expanded. On the left there is some volume loss at
the lung base blunting of the costophrenic gutters. The perihilar lung
markings are increased in conspicuity. The cardiac silhouette is mildly
enlarged. There is calcification in the region of the mitral valvular
annulus.
IMPRESSION: The findings likely reflect interstitial edema of cardiac
or noncardiac cause. I cannot exclude early interstitial pneumonia. There is
mild pulmonary vascular congestion and a small left pleural effusion. A
followup PA and lateral chest x-ray following therapy would be of value.

[REDACTED]

## 2013-04-13 ENCOUNTER — Ambulatory Visit: Payer: Self-pay | Admitting: Oncology

## 2013-04-16 LAB — COMPREHENSIVE METABOLIC PANEL
Alkaline Phosphatase: 95 U/L (ref 50–136)
Anion Gap: 10 (ref 7–16)
BUN: 22 mg/dL — ABNORMAL HIGH (ref 7–18)
Bilirubin,Total: 1.3 mg/dL — ABNORMAL HIGH (ref 0.2–1.0)
Calcium, Total: 8.7 mg/dL (ref 8.5–10.1)
Creatinine: 1.03 mg/dL (ref 0.60–1.30)
Glucose: 184 mg/dL — ABNORMAL HIGH (ref 65–99)
Potassium: 4.7 mmol/L (ref 3.5–5.1)
SGOT(AST): 17 U/L (ref 15–37)
SGPT (ALT): 14 U/L (ref 12–78)
Total Protein: 6.2 g/dL — ABNORMAL LOW (ref 6.4–8.2)

## 2013-04-16 LAB — CBC CANCER CENTER
Bands: 11 %
Basophil: 7 %
Blast: 8 %
Eosinophil: 2 %
HGB: 7.6 g/dL — ABNORMAL LOW (ref 12.0–16.0)
Lymphocytes: 7 %
MCH: 33.3 pg (ref 26.0–34.0)
Metamyelocyte: 5 %
Monocytes: 8 %
Myelocyte: 13 %
NRBC/100 WBC: 7 /100
RBC: 2.3 10*6/uL — ABNORMAL LOW (ref 3.80–5.20)
RDW: 26.5 % — ABNORMAL HIGH (ref 11.5–14.5)
Segmented Neutrophils: 37 %
Variant Lymphocyte: 2 %
WBC: 55.6 x10 3/mm — ABNORMAL HIGH (ref 3.6–11.0)

## 2013-04-30 LAB — CBC CANCER CENTER
Bands: 8 %
HGB: 6.2 g/dL — ABNORMAL LOW (ref 12.0–16.0)
Lymphocytes: 2 %
MCH: 34.7 pg — ABNORMAL HIGH (ref 26.0–34.0)
MCHC: 30.8 g/dL — ABNORMAL LOW (ref 32.0–36.0)
Metamyelocyte: 10 %
Monocytes: 3 %
Myelocyte: 25 %
Platelet: 30 x10 3/mm — CL (ref 150–440)
RDW: 28.9 % — ABNORMAL HIGH (ref 11.5–14.5)
Segmented Neutrophils: 25 %
WBC: 115.5 x10 3/mm — ABNORMAL HIGH (ref 3.6–11.0)

## 2013-04-30 LAB — BASIC METABOLIC PANEL
Anion Gap: 9 (ref 7–16)
BUN: 14 mg/dL (ref 7–18)
Chloride: 92 mmol/L — ABNORMAL LOW (ref 98–107)
Co2: 23 mmol/L (ref 21–32)
EGFR (African American): 55 — ABNORMAL LOW
EGFR (Non-African Amer.): 47 — ABNORMAL LOW
Glucose: 145 mg/dL — ABNORMAL HIGH (ref 65–99)
Osmolality: 253 (ref 275–301)
Sodium: 124 mmol/L — ABNORMAL LOW (ref 136–145)

## 2013-05-13 ENCOUNTER — Ambulatory Visit: Payer: Self-pay | Admitting: Oncology

## 2013-05-14 LAB — CBC CANCER CENTER
Bands: 11 %
Eosinophil: 2 %
HCT: 21.3 % — ABNORMAL LOW (ref 35.0–47.0)
HGB: 6.8 g/dL — ABNORMAL LOW (ref 12.0–16.0)
Lymphocytes: 5 %
MCH: 35 pg — ABNORMAL HIGH (ref 26.0–34.0)
MCHC: 32.1 g/dL (ref 32.0–36.0)
MCV: 109 fL — ABNORMAL HIGH (ref 80–100)
Metamyelocyte: 7 %
Monocytes: 7 %
Myelocyte: 13 %
Platelet: 16 x10 3/mm — CL (ref 150–440)
RBC: 1.95 10*6/uL — ABNORMAL LOW (ref 3.80–5.20)
RDW: 27.7 % — ABNORMAL HIGH (ref 11.5–14.5)
Segmented Neutrophils: 41 %
WBC: 65.7 x10 3/mm — ABNORMAL HIGH (ref 3.6–11.0)

## 2013-05-14 LAB — COMPREHENSIVE METABOLIC PANEL
Albumin: 3.9 g/dL (ref 3.4–5.0)
Alkaline Phosphatase: 71 U/L (ref 50–136)
BUN: 24 mg/dL — ABNORMAL HIGH (ref 7–18)
Bilirubin,Total: 2.3 mg/dL — ABNORMAL HIGH (ref 0.2–1.0)
Creatinine: 0.93 mg/dL (ref 0.60–1.30)
EGFR (African American): >60
Glucose: 153 mg/dL — ABNORMAL HIGH (ref 65–99)
Osmolality: 272 (ref 275–301)
Potassium: 4.3 mmol/L (ref 3.5–5.1)
SGOT(AST): 17 U/L (ref 15–37)
Total Protein: 6.3 g/dL — ABNORMAL LOW (ref 6.4–8.2)

## 2013-05-28 LAB — CBC CANCER CENTER
Blast: 5 %
HCT: 20.9 % — ABNORMAL LOW (ref 35.0–47.0)
HGB: 6.6 g/dL — ABNORMAL LOW (ref 12.0–16.0)
MCHC: 31.4 g/dL — ABNORMAL LOW (ref 32.0–36.0)
MCV: 108 fL — ABNORMAL HIGH (ref 80–100)
Metamyelocyte: 9 %
Platelet: 23 x10 3/mm — CL (ref 150–440)
RBC: 1.93 10*6/uL — ABNORMAL LOW (ref 3.80–5.20)
RDW: 25.5 % — ABNORMAL HIGH (ref 11.5–14.5)
Segmented Neutrophils: 43 %

## 2013-05-28 LAB — BASIC METABOLIC PANEL
Co2: 24 mmol/L (ref 21–32)
Creatinine: 0.87 mg/dL (ref 0.60–1.30)
EGFR (African American): >60
EGFR (Non-African Amer.): >60
Glucose: 148 mg/dL — ABNORMAL HIGH (ref 65–99)
Sodium: 136 mmol/L (ref 136–145)

## 2013-06-11 LAB — CBC CANCER CENTER
Bands: 5 %
Basophil: 7 %
Blast: 12 %
HCT: 18.5 % — ABNORMAL LOW (ref 35.0–47.0)
HGB: 5.9 g/dL — ABNORMAL LOW (ref 12.0–16.0)
MCH: 35.6 pg — ABNORMAL HIGH (ref 26.0–34.0)
MCHC: 32.1 g/dL (ref 32.0–36.0)
MCV: 111 fL — ABNORMAL HIGH (ref 80–100)
Metamyelocyte: 3 %
Monocytes: 5 %
Myelocyte: 13 %
NRBC/100 WBC: 99 /100
Promyelocyte: 3 %
Variant Lymphocyte: 1 %

## 2013-06-11 LAB — COMPREHENSIVE METABOLIC PANEL
Alkaline Phosphatase: 70 U/L (ref 50–136)
Anion Gap: 11 (ref 7–16)
Bilirubin,Total: 2.2 mg/dL — ABNORMAL HIGH (ref 0.2–1.0)
Chloride: 96 mmol/L — ABNORMAL LOW (ref 98–107)
Co2: 24 mmol/L (ref 21–32)
Creatinine: 1.08 mg/dL (ref 0.60–1.30)
EGFR (African American): 55 — ABNORMAL LOW
EGFR (Non-African Amer.): 47 — ABNORMAL LOW
Glucose: 185 mg/dL — ABNORMAL HIGH (ref 65–99)
Potassium: 4.4 mmol/L (ref 3.5–5.1)

## 2013-06-13 ENCOUNTER — Ambulatory Visit: Payer: Self-pay | Admitting: Oncology

## 2013-06-25 LAB — COMPREHENSIVE METABOLIC PANEL
Alkaline Phosphatase: 86 U/L (ref 50–136)
Anion Gap: 11 (ref 7–16)
BUN: 24 mg/dL — ABNORMAL HIGH (ref 7–18)
Bilirubin,Total: 2.8 mg/dL — ABNORMAL HIGH (ref 0.2–1.0)
Chloride: 91 mmol/L — ABNORMAL LOW (ref 98–107)
Creatinine: 1.04 mg/dL (ref 0.60–1.30)
EGFR (African American): 58 — ABNORMAL LOW
EGFR (Non-African Amer.): 50 — ABNORMAL LOW
Osmolality: 262 (ref 275–301)
SGPT (ALT): 21 U/L (ref 12–78)
Sodium: 125 mmol/L — ABNORMAL LOW (ref 136–145)
Total Protein: 5.7 g/dL — ABNORMAL LOW (ref 6.4–8.2)

## 2013-06-25 LAB — CBC CANCER CENTER
Bands: 10 %
Blast: 6 %
HCT: 16.1 % — ABNORMAL LOW (ref 35.0–47.0)
Lymphocytes: 8 %
MCH: 35.1 pg — ABNORMAL HIGH (ref 26.0–34.0)
MCHC: 32.4 g/dL (ref 32.0–36.0)
MCV: 108 fL — ABNORMAL HIGH (ref 80–100)
Metamyelocyte: 9 %
Monocytes: 6 %
Platelet: 16 x10 3/mm — CL (ref 150–440)
Promyelocyte: 4 %
RBC: 1.48 10*6/uL — ABNORMAL LOW (ref 3.80–5.20)
RDW: 24 % — ABNORMAL HIGH (ref 11.5–14.5)
Segmented Neutrophils: 40 %

## 2013-06-25 LAB — URINALYSIS, COMPLETE
Nitrite: NEGATIVE
Ph: 7 (ref 4.5–8.0)
Protein: 25
RBC,UR: 15 /HPF (ref 0–5)
Squamous Epithelial: 1
WBC UR: 478 /HPF (ref 0–5)

## 2013-06-27 LAB — URINE CULTURE

## 2013-07-09 LAB — COMPREHENSIVE METABOLIC PANEL
Albumin: 3.7 g/dL (ref 3.4–5.0)
Alkaline Phosphatase: 84 U/L (ref 50–136)
Anion Gap: 12 (ref 7–16)
Bilirubin,Total: 2.8 mg/dL — ABNORMAL HIGH (ref 0.2–1.0)
Calcium, Total: 8.3 mg/dL — ABNORMAL LOW (ref 8.5–10.1)
Chloride: 90 mmol/L — ABNORMAL LOW (ref 98–107)
Co2: 22 mmol/L (ref 21–32)
Creatinine: 0.93 mg/dL (ref 0.60–1.30)
EGFR (African American): >60
EGFR (Non-African Amer.): 57 — ABNORMAL LOW
Potassium: 4.4 mmol/L (ref 3.5–5.1)
SGOT(AST): 43 U/L — ABNORMAL HIGH (ref 15–37)
SGPT (ALT): 28 U/L (ref 12–78)
Sodium: 124 mmol/L — ABNORMAL LOW (ref 136–145)
Total Protein: 5.8 g/dL — ABNORMAL LOW (ref 6.4–8.2)

## 2013-07-09 LAB — CBC CANCER CENTER
Bands: 9 %
Basophil: 2 %
Blast: 5 %
HCT: 15.9 % — ABNORMAL LOW (ref 35.0–47.0)
MCHC: 33.5 g/dL (ref 32.0–36.0)
MCV: 94 fL (ref 80–100)
Monocytes: 8 %
WBC: 51.5 x10 3/mm — ABNORMAL HIGH (ref 3.6–11.0)

## 2013-07-14 ENCOUNTER — Ambulatory Visit: Payer: Self-pay | Admitting: Oncology

## 2013-07-23 ENCOUNTER — Ambulatory Visit: Payer: Self-pay | Admitting: Oncology

## 2013-07-23 LAB — CBC CANCER CENTER
Blast: 5 %
HGB: 5.2 g/dL — ABNORMAL LOW (ref 12.0–16.0)
MCH: 29.7 pg (ref 26.0–34.0)
MCHC: 30.6 g/dL — ABNORMAL LOW (ref 32.0–36.0)
MCV: 97 fL (ref 80–100)
Metamyelocyte: 6 %
Monocytes: 8 %
NRBC/100 WBC: 104 /100
Platelet: 21 x10 3/mm — CL (ref 150–440)
WBC: 56.7 x10 3/mm — ABNORMAL HIGH (ref 3.6–11.0)

## 2013-07-23 LAB — COMPREHENSIVE METABOLIC PANEL
Albumin: 3.5 g/dL (ref 3.4–5.0)
Alkaline Phosphatase: 76 U/L (ref 50–136)
Anion Gap: 9 (ref 7–16)
BUN: 25 mg/dL — ABNORMAL HIGH (ref 7–18)
Chloride: 96 mmol/L — ABNORMAL LOW (ref 98–107)
EGFR (Non-African Amer.): 50 — ABNORMAL LOW
Glucose: 148 mg/dL — ABNORMAL HIGH (ref 65–99)
SGOT(AST): 33 U/L (ref 15–37)
SGPT (ALT): 34 U/L (ref 12–78)
Sodium: 129 mmol/L — ABNORMAL LOW (ref 136–145)
Total Protein: 5.6 g/dL — ABNORMAL LOW (ref 6.4–8.2)

## 2013-07-23 LAB — IRON AND TIBC
Iron Bind.Cap.(Total): 265 ug/dL (ref 250–450)
Iron: 218 ug/dL — ABNORMAL HIGH (ref 50–170)

## 2013-07-24 LAB — URINALYSIS, COMPLETE
Ketone: NEGATIVE
Nitrite: POSITIVE
Ph: 6 (ref 4.5–8.0)
Specific Gravity: 1.006 (ref 1.003–1.030)
Squamous Epithelial: 2
WBC UR: 68 /HPF (ref 0–5)

## 2013-08-05 ENCOUNTER — Inpatient Hospital Stay: Payer: Self-pay | Admitting: Oncology

## 2013-08-05 LAB — CBC CANCER CENTER
HCT: 18.1 % — ABNORMAL LOW (ref 35.0–47.0)
Lymphocytes: 9 %
MCH: 29.3 pg (ref 26.0–34.0)
Metamyelocyte: 2 %
Monocytes: 9 %
Platelet: 48 x10 3/mm — ABNORMAL LOW (ref 150–440)
Promyelocyte: 4 %
Segmented Neutrophils: 64 %
WBC: 54.1 x10 3/mm — ABNORMAL HIGH (ref 3.6–11.0)

## 2013-08-05 LAB — COMPREHENSIVE METABOLIC PANEL
Albumin: 3.1 g/dL — ABNORMAL LOW (ref 3.4–5.0)
Alkaline Phosphatase: 76 U/L (ref 50–136)
Anion Gap: 10 (ref 7–16)
BUN: 30 mg/dL — ABNORMAL HIGH (ref 7–18)
Bilirubin,Total: 2.2 mg/dL — ABNORMAL HIGH (ref 0.2–1.0)
Calcium, Total: 8 mg/dL — ABNORMAL LOW (ref 8.5–10.1)
Creatinine: 1.05 mg/dL (ref 0.60–1.30)
EGFR (African American): 57 — ABNORMAL LOW
EGFR (Non-African Amer.): 49 — ABNORMAL LOW
Glucose: 515 mg/dL (ref 65–99)
Potassium: 5.1 mmol/L (ref 3.5–5.1)
SGOT(AST): 11 U/L — ABNORMAL LOW (ref 15–37)
SGPT (ALT): 12 U/L (ref 12–78)

## 2013-08-05 LAB — URINALYSIS, COMPLETE
Bilirubin,UR: NEGATIVE
Blood: NEGATIVE
Glucose,UR: >=500 mg/dL (ref 0–75)
Leukocyte Esterase: NEGATIVE
Nitrite: NEGATIVE
Ph: 6 (ref 4.5–8.0)
RBC,UR: 1 /HPF (ref 0–5)
WBC UR: 2 /HPF (ref 0–5)

## 2013-08-06 DIAGNOSIS — R000 Tachycardia, unspecified: Secondary | ICD-10-CM

## 2013-08-06 LAB — CBC WITH DIFFERENTIAL/PLATELET
Bands: 3 %
Lymphocytes: 7 %
MCH: 30.1 pg (ref 26.0–34.0)
Metamyelocyte: 1 %
Monocytes: 5 %
Myelocyte: 5 %
Platelet: 27 10*3/uL — CL (ref 150–440)
RBC: 2.58 10*6/uL — ABNORMAL LOW (ref 3.80–5.20)
WBC: 33.3 10*3/uL — ABNORMAL HIGH (ref 3.6–11.0)

## 2013-08-06 LAB — COMPREHENSIVE METABOLIC PANEL
Albumin: 2.9 g/dL — ABNORMAL LOW (ref 3.4–5.0)
Anion Gap: 8 (ref 7–16)
BUN: 24 mg/dL — ABNORMAL HIGH (ref 7–18)
Bilirubin,Total: 1.6 mg/dL — ABNORMAL HIGH (ref 0.2–1.0)
Calcium, Total: 8.1 mg/dL — ABNORMAL LOW (ref 8.5–10.1)
Co2: 22 mmol/L (ref 21–32)
EGFR (African American): >60
EGFR (Non-African Amer.): >60
Osmolality: 259 (ref 275–301)
Potassium: 4.3 mmol/L (ref 3.5–5.1)
SGOT(AST): 7 U/L — ABNORMAL LOW (ref 15–37)
SGPT (ALT): 12 U/L (ref 12–78)

## 2013-08-06 LAB — BASIC METABOLIC PANEL
Anion Gap: 12 (ref 7–16)
BUN: 26 mg/dL — ABNORMAL HIGH (ref 7–18)
Calcium, Total: 8.9 mg/dL (ref 8.5–10.1)
Chloride: 88 mmol/L — ABNORMAL LOW (ref 98–107)
Co2: 21 mmol/L (ref 21–32)
Creatinine: 1.26 mg/dL (ref 0.60–1.30)
EGFR (African American): 46 — ABNORMAL LOW
EGFR (Non-African Amer.): 39 — ABNORMAL LOW
Glucose: 295 mg/dL — ABNORMAL HIGH (ref 65–99)
Osmolality: 260 (ref 275–301)
Potassium: 4.4 mmol/L (ref 3.5–5.1)
Sodium: 121 mmol/L — ABNORMAL LOW (ref 136–145)

## 2013-08-06 LAB — TROPONIN I: Troponin-I: 0.06 ng/mL — ABNORMAL HIGH

## 2013-08-07 LAB — CBC WITH DIFFERENTIAL/PLATELET
Bands: 2 %
Bands: 2 %
Blast: 3 %
Eosinophil: 1 %
HCT: 30.3 % — ABNORMAL LOW (ref 35.0–47.0)
HGB: 10.6 g/dL — ABNORMAL LOW (ref 12.0–16.0)
HGB: 12.1 g/dL (ref 12.0–16.0)
Lymphocytes: 7 %
Lymphocytes: 8 %
MCH: 29.6 pg (ref 26.0–34.0)
MCH: 29.9 pg (ref 26.0–34.0)
MCHC: 34.9 g/dL (ref 32.0–36.0)
MCV: 85 fL (ref 80–100)
Metamyelocyte: 3 %
Monocytes: 11 %
Myelocyte: 4 %
NRBC/100 WBC: 2 /
Platelet: 52 10*3/uL — ABNORMAL LOW (ref 150–440)
Promyelocyte: 1 %
Promyelocyte: 2 %
RDW: 15.3 % — ABNORMAL HIGH (ref 11.5–14.5)
RDW: 15.6 % — ABNORMAL HIGH (ref 11.5–14.5)
Segmented Neutrophils: 64 %
WBC: 57.3 10*3/uL — ABNORMAL HIGH (ref 3.6–11.0)

## 2013-08-07 LAB — BASIC METABOLIC PANEL
Anion Gap: 12 (ref 7–16)
Chloride: 89 mmol/L — ABNORMAL LOW (ref 98–107)
Co2: 20 mmol/L — ABNORMAL LOW (ref 21–32)
Creatinine: 1.09 mg/dL (ref 0.60–1.30)
EGFR (African American): 54 — ABNORMAL LOW
Glucose: 379 mg/dL — ABNORMAL HIGH (ref 65–99)
Osmolality: 265 (ref 275–301)
Sodium: 121 mmol/L — ABNORMAL LOW (ref 136–145)

## 2013-08-07 LAB — MAGNESIUM: Magnesium: 1.8 mg/dL

## 2013-08-08 LAB — URINE CULTURE

## 2013-08-11 LAB — CULTURE, BLOOD (SINGLE)

## 2013-08-12 LAB — CULTURE, BLOOD (SINGLE)

## 2013-08-13 ENCOUNTER — Ambulatory Visit: Payer: Self-pay | Admitting: Oncology

## 2013-08-13 DEATH — deceased

## 2015-03-05 NOTE — Discharge Summary (Signed)
Dates of Admission and Diagnosis:   Date of Admission 19-Nov-2012    Date of Discharge 22-Nov-2012    Admitting Diagnosis 1. 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  Hypertension:    2 ??  Diabetes    3 Coronary artery disease    4 Appendectomy    5 Bilateral knee replacement     Chief Complaint/History of Present Illness ??  HPI  patient is here today for further evaluation.  Feeling extremely weak and tired.  Hemoglobin is dropped to 6.7.  Has increasing pain in the left upper quadrant.  Ultrasound shows, splenomegaly. few days ago patient developed cardiologist treated by primary care physician with Cipro and Flagyl.  Diarrhea has improved.increasing shortness of breath on exertion.is intermittent swelling of lower extremity. rising WBC concurrent consistent with myeloproliferative disease. in view of progressive symptoms and hemoglobin of 6.7 g patient is now being admitted in the hospital for multiple blood transfusion.assessment with stool for occult blood.  Iron studies.  In starting patient on Hydrea   Rhogam:  07-Jan-14 12:02    Notification PREVIOUSLY NOTIFIED   Notification Date 11-19-12   Notification Time 1650 (Result(s) reported on 19 Nov 2012 at 04:56PM.)  Hepatic:  08-Jan-14 05:39    Bilirubin, Total  4.8   Alkaline Phosphatase 75   SGPT (ALT) 25   SGOT (AST)  80   Total Protein, Serum  5.8   Albumin, Serum  3.3  Oncology:  07-Jan-14 12:02    BCR-ABL1 Transcript Detection of CML and ALL, Qt. ========== TEST NAME ==========  ========= RESULTS =========  = REFERENCE RANGE =  BCR-ABL1,CML,PCR, Quant.  BCR-ABL1, CML/ALL, PCR, Quant b2a2 transcript                 [   Final Report %       ]                                             <0.001 %                  (sensitivity limit of assay) b3a2 transcript                 [   Final Report %       ]                                             <0.001 %                 (sensitivity limit of assay) e1a2 transcript                 [   Final Report %       ]                                             <0.001 %                 (  sensitivity limit of assay) Interpretation:                 [   Final Report         ]                   The quantitative RT-PCR assay is negative for the b2a2 and b3a2 (p210) and e1a2 (p190) fusion gene transcripts found in chronic myelogenous leukemia and [place] positive acute lymphocytic leukemia. These results do not rule out the presence of low levels of BCR-ABL1 transcript below the level of detection of this assay, or the presence of rare BCR-ABL1 transcripts not detected by this assay. Director Review                 [   Final Report         ]                   Marc Morgans, M.D., Ph.D., [facility] -                 Associate Director, [place] for Molecular                Biology and Pathology                [place], [place]                                                                      . Background:                     [   Final Report         ]                   **The b2a2 and b3a2 fusion transcripts value are titrated to the current International Scale(IS). The standardized baseline is 100% BCR-ABL(IS) and the major molecular response is 0.1% BCR-ABL(IS). The limit of detection is 0.001%.**                                                            . The BCR-ABL1 translocation is found in virtually all cases of CML and in approximately 15-30% of cases of ALL. This fusion gene is formed by a (9;22)(q34;q11.2) translocation, resulting in the fusion of the breakpoint cluster region (BCR) on chromosome 22 with the Abelson protooncogene (ABL1) from chromosome 9. The derivative chromosome  22 of this translocation is known as the [place] chromosome. In the majority of CML patients, the breakpoint in the BCR gene is located in a 5.8kb region spanning five exons, b1-b5 (exons 12-16), the major breakpoint cluster region (M-bcr). Most commonly, the breakpoint occurs in the introns following b2 or b3, which join with ABL1 exon 2 (a2), forming the fusion transcript b2a2 or b3a2. These chimeric  transcripts encode a 210kDa BCR-ABL1 fusion protein referred to as p210. In approximately 3% of CML cases, this gene fusion occurs without cytogenetic evidence of the rearrangement. In approximately 75% of [place] positive ALL patients, the breakpoint in the BCR gene is located within intron 1, at the minor breakpoint cluster region (m-bcr). Exon 1 (e1) ofBCR joins with the ABL1 a2 (exon 2), forming the fusion transcript e1a2, encoding a 190kDa BCR-ABL1 fusion protein referred to as p190. The remaining approximately 25% of [place] positive ALL patients are found to be positive for breakpointswithin the M-bcr.                                                            . Methodology: Total RNA was isolated from the provided specimen, and subjected to RT-PCR. Amplification products were monitored by real-time PCR using primers/probes specific for the e1a2, b2a2 and b3a2 BCR-ABL1 fusion transcripts. An additional RT-PCR amplification of the ABL1 transcript was performed to control for cDNA quantity and quality. Serial dilutions of RNA isolated from a cell line with a known t(9;22) was utilized for quantification of BCR-ABL1 fusion transcripts relative to the ABL1 control gene. Numeric results are reported as % BCR-ABL1/ABL1. The b2a2 and b3a2 fusion transcripts value are titrated to the current International Scale(IS) using ARQ IS calibrator panel ([place].). In vitro studies have indicated that this assay has an analytical sensitivity that allows for the detection  of approximately 1 cell containing the p190/p210 BCR-ABL1 fusion gene in a background of 100,000 normal cells. All test results should be interpreted in conjunction with clinical and other laboratory findings for the most accurate interpretation.                                                            . Reference: Anastasia Fiedler and Branford S: Molecular Monitoring of Chronic Myeloid Leukemia. Seminars in Hematology 2003; 40 (2, Suppl 2):62-68. White HE et al. [facility] of the first [employer] Reference Panel for quantitation of BCR-ABL mRNA. Blood 2010;116(22):e111-7.               [facility]                   No: [id number]           [address] [place], [place] [address]           Nechama Guard, MD      [phone]   Result(s) reported on 22 Nov 2012 at 11:18PM.   CML Profile ========== TEST NAME ==========  ========= RESULTS =========  = REFERENCE RANGE =  CML CHROMOSOME/FISH PROF  CML Chromosome/FISH Profile CML FISH                        [   Final Report         ]                   The test has been resulted. The testing report will be sent by fax, mail and/or electronic interface reporting, based  on client result delivery set up. Director Review:                [   Comment:             ]                   Chandra Batch, PhD, [place].          [   Final Report         ]                   The test has been resulted. The testing report will be sent by fax, mail and/or electronic interface reporting, based on client result delivery set up. Director Review:                [   Comment:             ]                   Rudell Cobb. [place], PhD, [facility]               [facility]                   No: [id number]           [address], [place], [place] [address]           Nechama Guard, MD      [phone]   Result(s) reported on 29 Nov 2012 at 11:50AM.  LabUnknown:  08-Jan-14 05:39     Result Interpretation Blood smear from 11/20/12 reviewed. I previously reviewed a blood smear on 11/11/12. I agree with the reported differential. The platelet count has decreased slightly, the number of nucleated RBCs has increased, and there is an increase in WBC withno significant change in the blast percentage. The "other cells" are immature granulocytes which are difficult to classify. Blain Pais, MD.  Routine BB:  07-Jan-14 12:02    Antibody 1 Anti- LITTLE c   ABO Group + Rh Type O Positive   Antibody Screen POSITIVE (Result(s) reported on 19 Nov 2012 at 04:49PM.)   Crossmatch Unit 1 Transfused   Crossmatch Unit 2 Transfused   Crossmatch Unit 3 Transfused  Result(s) reported on 21 Nov 2012 at 08:14AM.  Cardiology:  07-Jan-14 13:20    Ventricular Rate 78   Atrial Rate 78   P-R Interval 172   QRS Duration 86   QT 414   QTc 471   P Axis 29   R Axis -19   T Axis 51   ECG interpretation Normal sinus rhythm Minimal voltage criteria for LVH, may be normal variant Borderline ECG When compared with ECG of 11-Aug-2011 14:13, Criteria for Inferior infarct are no longer Present Confirmed by Ronnald Collum, SAM ([id number]) on 11/19/2012 7:55:48 PM  Overreader: Hipolito Bayley  Routine Chem:  07-Jan-14 12:02    Iron Binding Capacity (TIBC) 265   Unbound Iron Binding Capacity 180   Iron, Serum 85   Iron Saturation 32 (Result(s) reported on 19 Nov 2012 at 01:18PM.)   Ferritin [facility])  1353 (Result(s) reported on 19 Nov 2012 at 12:51PM.)  08-Jan-14 05:39    Result Comment WBC - Corrected for NRBCs Blasts - NOTIFIED OF CRITICAL VALUE  - DLQ to Earnest Bailey RN @ [phone] on  [phone]. Other Cells - PATHOLOGIST TO REVIEW SMEAR. COMMENTS  - APPEAR ON REPORT WHEN COMPLETE. Blasts -  READ-BACK PROCESS PERFORMED.  Result(s) reported on 20 Nov 2012 at 06:58AM.   Glucose, Serum  185   BUN 13   Creatinine (comp)  0.53   Sodium, Serum  128   Potassium, Serum  3.4   Chloride, Serum  95   CO2, Serum 22    Calcium (Total), Serum  7.6   Osmolality (calc) 262   eGFR (African American) >60   eGFR (Non-African American) >60 (eGFR values <94mL/min/1.73 m2 may be an indication of chronic kidney disease (CKD). Calculated eGFR is useful in patients with stable renal function. The eGFR calculation will not be reliable in acutely ill patients when serum creatinine is changing rapidly. It is not useful in  patients on dialysis. The eGFR calculation may not be applicable to patients at the low and high extremes of body sizes, pregnant women, and vegetarians.)   Anion Gap 11  Routine Hem:  08-Jan-14 05:39    WBC (CBC)  45.0   RBC (CBC)  2.58   Hemoglobin (CBC)  7.9   Hematocrit (CBC)  23.2   Platelet Count (CBC)  36   MCV 90   MCH 30.5   MCHC 34.0   RDW  20.7   Bands 8   Segmented Neutrophils 35   Lymphocytes 10   Monocytes 6   Eosinophil 2   Basophil 5   Metamyelocyte 3   Myelocyte 5   Promyelocyte 15   Blast-Like  5   NRBC 76   Diff Comment 1 ANISOCYTOSIS   Diff Comment 2 POIKILOCYTOSIS   Diff Comment 3 POLYCHROMASIA   Other Cells 6   Diff Comment 4 HYPOGRANULAR NEUTRO   Diff Comment 5 BURR CELLS   Diff Comment 6 PLTS VARIED IN SIZE  Result(s) reported on 20 Nov 2012 at 06:58AM.   PERTINENT RADIOLOGY STUDIES: XRay:    07-Jan-14 12:44, Chest 1 View AP or PA   Chest 1 View AP or PA    REASON FOR EXAM:    shortness of breath  COMMENTS:       PROCEDURE: DXR - DXR CHEST 1 VIEWAP OR PA  - Nov 19 2012 12:44PM     RESULT: Comparison is made to previous exam dated 11 August 2011.   There is blunting of the left costophrenic angle suggestive of small   amount of pleural effusion. Atherosclerotic calcification is seen within   the aortic arch. The lungs are clear. The bony and mediastinal structures   are unremarkable. The cardiac silhouette appears borderline enlarged.    IMPRESSION:   1. Small left pleural effusion. Atherosclerotic disease. Hyperinflation   consistent with  COPD.  Dictation Site: 2        Verified By: Sundra Aland, M.D., MD  [place]:    31-Dec-13 09:00, US Abdomen Limited Survey   US Abdomen Limited Survey    REASON FOR EXAM:    Spleen leukocyposis  COMMENTS:       PROCEDURE: [place]  - US ABDOMEN LIMITED SURVEY  - Nov 12 2012  9:00AM     RESULT: History: Leukocytosis.     Findings: Comparison made to prior CT of 04/30/2012. Spleen is enlarged at   19.7 cm. Splenic vessels are patent with normal directional flow.    IMPRESSION:  Splenomegaly.        Verified By: Osa Craver, M.D., MD  LabUnknown:    26-Nov-13 14:10, CT Sinuses WO Contrast   CTSINUS-1    REASON FOR EXAM:    chronic  sinusitis  COMMENTS:       PROCEDURE: KCT - KCT SINUSES WITHOUT CONTRAST  - Oct 08 2012  2:10PM     RESULT: Axial CT scanning was performed through paranasal sinuses using a   bone algorithm. Images were reconstructed at 62mm intervals and slice   thicknesses. Coronal reconstructions were obtained as well.    The frontal sinuses are hypoplastic. The ethmoid, right maxillary, and   sphenoid sinuses are clear. There is soft tissue density in the inferior   aspect of the left maxillary sinus both compatible with mucoperiosteal   thickening. The nasal septum is midline. The nasal passages are patent.   The patient has undergone previous right maxillary antrostomy. There is   hyperostosis of the frontal bones bilaterally. There are degenerative     changes of the temporomandibular joints bilaterally.    IMPRESSION:   1. There is soft tissue density material in the inferior aspect of the   left maxillary sinus. This may reflect mucoperiosteal thickening or   exudate.No fluid is demonstrated.  2. The patient appears to have undergone previous right maxillary   antrostomy.  3. The frontal sinuses are hypoplastic.     Dictation Site: 1        Verified By: DAVID A. [place], M.D., MD   PACS Image     30-Dec-13 14:08, [facility] CBC   Result  Interpretation    Blood smear is reviewed. There is moderate thrombocytopenia.  Extreme numbers of nucleated RBCs are present but there are no  teardrop cells. Marked leukocytosis is present with a full  spectrum of mature and immature granulocytes. Eosinophils and  basophils are increased. 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 Correlation with bone marrow biopsy is advised. These  findings were discussed with Dr. Oliva Bustard on 11/11/12. Intrade-  partmental consultation was obtained. Dr. Luana Shu   Result(s) reported on 11 Nov 2012 at 02:28PM.    31-Dec-13 09:00, US Abdomen Limited Survey   PACS Image     07-Jan-14 12:44, Chest 1 View AP or PA   PACS Image     08-Jan-14 05:39, CBC Profile   Result Interpretation    Blood smear from 11/20/12 reviewed. I previously reviewed a blood  smear on 11/11/12. I agree with the reported differential.  The platelet count has decreased slightly, the number of  nucleated RBCs has increased, and there is an increase in WBC  withno significant change in the blast percentage. The "other  cells" are immature granulocytes which are difficult to classify.  Blain Pais, MD.   Hospital Course:   Hospital Course She was admitted in the hospital.  No source of bleeding was identified.  Patient received 2 units of packed cells and platelet transfusion.  Gradually improve feeling better.  CML profiler was negative you for BCR ABL mutation.    Condition on Discharge Good   DISCHARGE INSTRUCTIONS HOME MEDS:  Medication Reconciliation:  Patient's Home Medications at Discharge:     Medication Instructions  multivitamin  1 tab(s) orally once a day   biotin 1000 mcg oral tablet  3 tab(s) orally once a day   vitamin b-12 100 mcg oral tablet  1 tab(s) orally once a day   calcium 600+d 600 mg-200 intl units oral tablet  1 tab(s) orally once a day   feosol iron  28 milligram(s) orally once a day    florastor 250 mg oral  capsule  2 cap(s) orally once a day   glipizide 5 mg oral tablet  3 tab(s) orally once a day   klor-con 10 oral tablet, extended release  1 tab(s) orally 2 times a day   paroxetine 10 mg oral tablet  1 tab(s) orally once a day   potassium gluconate 595 mg oral tablet  1 tab(s) orally once a day   duragesic-12 12 mcg/hr transdermal film, extended release  1 PATCH transdermal every 72 hours   metformin 500 mg oral tablet  1 tab(s) orally 2 times a day (with meals)   pantoprazole 40 mg oral delayed release tablet  1 tab(s) orally once a day   senokot 8.6 mg oral tablet  1 tab(s) orally once a day (at bedtime)   metoprolol tartrate 50 mg oral tablet  1 tab(s) orally every 12 hours   lorazepam 0.5 mg oral tablet  1 tab(s) orally once a day   hydroxyurea 500 mg oral capsule  1 cap(s) orally once a day   tramadol 50 mg oral tablet  1 tab(s) orally every 6 hours, As needed, pain   isosorbide mononitrate 20 mg oral tablet  1 tab(s) orally      PRESCRIPTIONS: PRINTED AND GIVEN TO PATIENT/FAMILY   STOP TAKING THE FOLLOWING MEDICATION(S):    aleve 220 mg oral tablet: 1 tab(s) orally every 8 hours, As Needed- for Pain  prednisone 10 mg oral tablet: 1 tab(s) orally once a day  Physician's Instructions:   Home Health? No    Home Oxygen? No    Diet Regular    Activity Limitations As tolerated    Return to Work Not Applicable    Time frame for Follow Up Appointment 1-2 weeks   Electronic Signatures: Jobe Gibbon (MD)  (Signed 21-Jan-14 07:49)  Authored: ADMISSION DATE AND DIAGNOSIS, CHIEF COMPLAINT/HPI, PERTINENT LABS, [place], PATIENT INSTRUCTIONS   Last Updated: 21-Jan-14 07:49 by Jobe Gibbon (MD)

## 2015-03-05 NOTE — Discharge Summary (Signed)
Dates of Admission and Diagnosis:  Date of Admission 05-Aug-2013   Date of Discharge 07-Aug-2013   Admitting Diagnosis Altered mental status   Final Diagnosis Sepsis   Discharge Diagnosis 1 S IRS   2 Myeloproliferative and myelodysplastic syndrome   3 Anemia transfusion dependent   4 Thrombocytopenia   5 Altered mental status due to metabolic encephalopathy   6 History of diabetes insulin-dependent   7 Hypertension   8 hyponatraemia    Chief Complaint/History of Present Illness 81 -year-old lady with known patient with myelofibrosis and myeloproliferative disease with multiple chromosomal abnormality dmitted with increasing confusion .  Low-grade fever Sodium of 118 Blood sugar of 5  45 Hemoglobin of 6 g ?? HPI 81 year old lady was brought been having increasing confusion.  Low-grade fever.  Declining condition.  Appetite is poor.persistent nausea losing weight.  Number of abnormality just described in the chief complaints are found so patient was admitted to the hospital for further evaluation and treatment consideration   Hepatic:  24-Sep-14 06:40   Bilirubin, Total  1.6  Alkaline Phosphatase 70  SGPT (ALT) 12  SGOT (AST)  7  Total Protein, Serum  5.4  Albumin, Serum  2.9  Routine Micro:  25-Sep-14 00:59   Organism Name CORYNEBACTERIUM JEIKEIUM  Micro Text Report BLOOD CULTURE   ORGANISM 1                CORYNEBACTERIUM JEIKEIUM   COMMENT                   IN AEROBIC BOTTLE ONLY   COMMENT                   -   [place]   ANTIBIOTIC                        Organism 1 CORYNEBACTERIUM JEIKEIUM  Culture Comment IN AEROBIC BOTTLE ONLY  Gram Stain 1 GRAM VARIABLE RODS  Culture Comment . -  Routine Chem:  24-Sep-14 06:40   Result Comment PLATELET - RESULTS VERIFIED BY REPEAT TESTING.  - NOTIFIED OF CRITICAL VALUE  - C/TO KRISTA YOUNG @ 0725 08/06/13.Marland KitchenCDF  - READ-BACK PROCESS PERFORMED.  - VERIFIED BY SMEAR ESTIMATE BLAST -  SLIDE PREVIOUSLY REVIEWED BY PATHOLOGIST  Result(s) reported on 06 Aug 2013 at 08:09AM.  Glucose, Serum  167  BUN  24  Creatinine (comp) 0.86  Sodium, Serum  125  Potassium, Serum 4.3  Chloride, Serum  95  CO2, Serum 22  Calcium (Total), Serum  8.1  Anion Gap 8  Osmolality (calc) 259  eGFR (African American) >60  eGFR (Non-African American) >60 (eGFR values <83m/min/1.73 m2 may be an indication of chronic kidney disease (CKD). Calculated eGFR is useful in patients with stable renal function. The eGFR calculation will not be reliable in acutely ill patients when serum creatinine is changing rapidly. It is not useful in  patients on dialysis. The eGFR calculation may not be applicable to patients at the low and high extremes of body sizes, pregnant women, and vegetarians.)    22:16   Result Comment TROPONIN - RESULTS VERIFIED BY REPEAT TESTING.  - CALLED TO MALKA SINWANY AT 2318 ON  - 08/06/13..Marland KitchenMarland KitchenPL  - READ-BACK PROCESS PERFORMED.  Result(s) reported on 06 Aug 2013 at 11:20PM.  Magnesium, Serum  1.3 (1.8-2.4 THERAPEUTIC RANGE: 4-7 mg/dL TOXIC: >  10 mg/dL  -----------------------)  Glucose, Serum  295  BUN  26  Creatinine (comp) 1.26  Sodium, Serum  121  Potassium, Serum 4.4  Chloride, Serum  88  CO2, Serum 21  Calcium (Total), Serum 8.9  Anion Gap 12  Osmolality (calc) 260  eGFR (African American)  46  eGFR (Non-African American)  39 (eGFR values <35m/min/1.73 m2 may be an indication of chronic kidney disease (CKD). Calculated eGFR is useful in patients with stable renal function. The eGFR calculation will not be reliable in acutely ill patients when serum creatinine is changing rapidly. It is not useful in  patients on dialysis. The eGFR calculation may not be applicable to patients at the low and high extremes of body sizes, pregnant women, and vegetarians.)  25-Sep-14 00:59   Result Comment AEROBIC BOTTLE - NOTIFIED OF CRITICAL VALUE  - LAB/JO LUM AT 1708  07/23/2013  - READ-BACK PROCESS PERFORMED.  Result(s) reported on 12 Aug 2013 at 08:30AM.  Magnesium, Serum 1.8 (1.8-2.4 THERAPEUTIC RANGE: 4-7 mg/dL TOXIC: > 10 mg/dL  -----------------------)  Glucose, Serum  379  BUN  29  Creatinine (comp) 1.09  Sodium, Serum  121  Potassium, Serum 4.4  Chloride, Serum  89  CO2, Serum  20  Calcium (Total), Serum  8.4  Anion Gap 12  Osmolality (calc) 265  eGFR (African American)  54  eGFR (Non-African American)  47 (eGFR values <616mmin/1.73 m2 may be an indication of chronic kidney disease (CKD). Calculated eGFR is useful in patients with stable renal function. The eGFR calculation will not be reliable in acutely ill patients when serum creatinine is changing rapidly. It is not useful in  patients on dialysis. The eGFR calculation may not be applicable to patients at the low and high extremes of body sizes, pregnant women, and vegetarians.)  Cardiac:  24-Sep-14 22:16   Troponin I  0.06 (0.00-0.05 0.05 ng/mL or less: NEGATIVE  Repeat testing in 3-6 hrs  if clinically indicated. >0.05 ng/mL: POTENTIAL  MYOCARDIAL INJURY. Repeat  testing in 3-6 hrs if  clinically indicated. NOTE: An increase or decrease  of 30% or more on serial  testing suggests a  clinically important change)  25-Sep-14 00:59   Troponin I  0.09 (0.00-0.05 0.05 ng/mL or less: NEGATIVE  Repeat testing in 3-6 hrs  if clinically indicated. >0.05 ng/mL: POTENTIAL  MYOCARDIAL INJURY. Repeat  testing in 3-6 hrs if  clinically indicated. NOTE: An increase or decrease  of 30% or more on serial  testing suggests a  clinically important change)  Routine Hem:  24-Sep-14 06:40   WBC (CBC)  33.3  RBC (CBC)  2.58  Hemoglobin (CBC)  7.8  Hematocrit (CBC)  22.1  Platelet Count (CBC)  27  MCV 86  MCH 30.1  MCHC 35.2  RDW  15.9  Bands 3  Segmented Neutrophils 75  Lymphocytes 7  Monocytes 5  Metamyelocyte 1  Myelocyte 5  Promyelocyte 2  Blast-Like  2  NRBC 3  Diff  Comment 1 ANISOCYTOSIS  Diff Comment 2 MICROCYTES PRESENT  Diff Comment 3 BASOPHILIC STIPPLING  Diff Comment 4 PLTS VARIED IN SIZE  Diff Comment 5 LARGE PLATELETS  Result(s) reported on 06 Aug 2013 at 08:09AM.    22:16   WBC (CBC)  57.3  RBC (CBC) 4.04  Hemoglobin (CBC) 12.1  Hematocrit (CBC)  34.6  Platelet Count (CBC)  52  MCV 86  MCH 29.9  MCHC 34.9  RDW  15.6  Bands 2  Segmented Neutrophils 64  Lymphocytes 8  Monocytes 11  Eosinophil 1  Metamyelocyte 3  Myelocyte 7  Promyelocyte 1  Blast-Like  3  NRBC 7  Diff Comment 1 ANISOCYTOSIS  Diff Comment 2 POIKILOCYTOSIS  Diff Comment 3 MICROCYTES PRESENT  Diff Comment 4 TOXIC VACUOLIZATION  Diff Comment 5 TOXIC GRANULATION  Diff Comment 6 BASOPHILIC STIPPLING  Diff Comment 7 PLTS VARIED IN SIZE  Result(s) reported on 07 Aug 2013 at 12:37AM.  25-Sep-14 00:59   WBC (CBC)  55.6  RBC (CBC)  3.57  Hemoglobin (CBC)  10.6  Hematocrit (CBC)  30.3  Platelet Count (CBC)  49  MCV 85  MCH 29.6  MCHC 34.9  RDW  15.3  Bands 2  Segmented Neutrophils 74  Lymphocytes 7  Monocytes 7  Eosinophil 1  Metamyelocyte 2  Myelocyte 4  Promyelocyte 2  Blast-Like  1  NRBC 2  Diff Comment 1 ANISOCYTOSIS  Diff Comment 2 POIKILOCYTOSIS  Diff Comment 3 TOXIC GRANULATION  Diff Comment 4 TOXIC VACUOLIZATION  Diff Comment 5 BASOPHILIC STIPPLING  Diff Comment 6 PLTS VARIED IN SIZE  Result(s) reported on 07 Aug 2013 at [facility] Course:  Hospital Course During hospital stay patient received 3% saline.  Received multiple red cell transfusion.  She  started spiking high fever blood cultures were obtained records is positive for gram-negative rods.  intrasvenous antibiotic was started.  Patient's condition did not improve significantly.  Considering patient had a prolonged ileus ready poor quality of life after prolonged discussion with the family it was decided that she should be transferred to hospice home for end-of-life  care. Patient had living will and was  DNR   Condition on Discharge Serious   DISCHARGE INSTRUCTIONS HOME MEDS:  Medication Reconciliation: Patient's Home Medications at Discharge:     Medication Instructions  metformin 500 mg oral tablet  2 tab(s) orally 2 times a day (with meals)   metoprolol tartrate 50 mg oral tablet  1 tab(s) orally every 12 hours   duragesic-12 transdermal film, extended release  1 patch transdermal every 72 hours PATIENT   jakafi 5 mg oral tablet  1 tab(s) orally 2 times a day   phenazopyridine 100 mg oral tablet  1 tab(s) orally 3 times a day (after meals)   prednisone 10 mg oral tablet  1 tab(s) orally once a day   pregabalin 75 mg oral capsule  1 cap(s) orally 2 times a day   trazodone 50 mg oral tablet  1 tab(s) orally once a day (at bedtime), As needed, insomnia   glipizide 10 mg oral tablet  1 tab(s) orally 2 times a day (before meals)   oxyfast 20 mg/ml oral concentrate  0.5-1 milliliter(s) orally every 1 to 2 hours, As Needed   levaquin 500 mg oral tablet  1 tab(s) orally every 24 hours    PRESCRIPTIONS: PRINTED AND GIVEN TO PATIENT/FAMILY; ELECTRONICALLY SUBMITTED   Physician's Instructions:  Home Health? No   Treatments None   Home Oxygen? Yes   Oxygen delivery at home: 2L  Nasal Cannula   Diet Regular   Dietary Supplements None   Diet Consistency Regular Consistency   Activity Limitations None   Referrals hospice   Return to Work Not Applicable   Time frame for Follow Up Appointment as needed   Time frame for Follow Up Appointment as needed   Electronic Signatures: Jobe Gibbon (MD)  (Signed 04-Oct-14 08:04)  Authored: ADMISSION DATE AND DIAGNOSIS, CHIEF COMPLAINT/HPI, PERTINENT [place]  INSTRUCTIONS HOME MEDS, PATIENT INSTRUCTIONS   Last Updated: 04-Oct-14 08:04 by Jobe Gibbon (MD)

## 2015-03-05 NOTE — H&P (Signed)
PATIENT NAME:  Ashley Roth, Ashley Roth MR#:  [id number] DATE OF BIRTH:  Nov 29, 1927  DATE OF ADMISSION:  01/04/2013  PRIMARY CARE PHYSICIAN:  Dr Bethann Punches.   CHIEF COMPLAINT:  Fever, cough, shortness of breath and generalized weakness.   HISTORY OF PRESENT ILLNESS:  The patient is an 81 year old pleasant white female with a past medical history of myelofibrosis and myelodysplastic syndrome with anemia and thrombocytopenia presented to her family oncologist, Dr. Doylene Canning.  The patient was noted to have increased bruising and severe generalized weakness.  The patient was found to have hemoglobin of 7.3 and platelet count of 30.  The patient received 1 unit of packed RBC as well as 1 unit of platelets.  The patient was told if develops any fever recommended to call the oncology office.  The patient was noted to have fever of 102.  Concerning this called back to Oncology Clinic who recommended the patient to come to the Emergency Department.  The patient has been experiencing severe generalized weakness starting from the beginning of this week.  Having a cough, mild to no productive sputum.  Has been experiencing severe generalized weakness compared to week prior.  Did not notice to have any fever.  Had significantly decreased appetite in the last 4 to 5 days.  Last night also complained of some abdominal pain in the epigastric area.  Denied having any black stools.  Denied having any nausea or vomiting.  Workup in the Emergency Department, a CT chest revealed bibasilar infiltrates  concerning for infection.  The patient had WBC count of 26,000 when the patient presented to Oncology Clinic.  Currently having WBC count of 42,000.  The patient received in the Emergency Department Levaquin 750 mg IV.    PAST MEDICAL HISTORY:   1.  Myelodysplastic syndrome.  2.  Myelofibrosis.  3.  Anemia secondary to myelofibrosis.  4.  Pancytopenia.  5.  Diabetes mellitus.  6.  Hypertension.  7.  Degenerative joint disease.  8.   Gastroesophageal reflux disease.  9.  Hyperlipidemia.  10.  Irregular heartbeat.   PAST SURGICAL HISTORY:   1.  Bilateral knee surgery.  2.  Left arm surgery.  3.  Nasal surgery.  4.  Sinus surgery.  5.  Appendectomy.  6.  Bilateral eye surgery.   ALLERGIES:   1.  PENICILLIN.  2.  DARVON.  3.  CODEINE.  4.  CEFTIN.  5.  BIAXIN.  6.  MORPHINE.  7.  PRINIVIL.   HOME MEDICATIONS:  1.  Tramadol 50 mg every 6 hours.  2.  Prednisone 1 tablet daily.  3.  MiraLAX once a day as needed.  4.  Metoprolol 50 mg 1 tablet every 12 hours.  5.  Metformin 500 mg 2 times a day.  6.  Imdur 20 mg daily.  7.  Hydrocortisone suppository 2 times a day as needed.  8.  Glipizide mg daily.  9.  Duragesic patch.   SOCIAL HISTORY:  No history of tobacco, alcohol or drug use.  Lives by herself.  Has 2 daughters who closely follow up with the patient as well as stay with her at night since she has been sick.    FAMILY HISTORY:  Negative for coronary artery disease and MI.   REVIEW OF SYSTEMS:   CONSTITUTIONAL:  Generalized weakness, fatigue.  EYES:  No blurred vision, redness.  ENT:  No tinnitus, ear pain or difficulty swallowing.  RESPIRATORY:  Has cough, shortness of breath.  No hemoptysis.  CARDIOVASCULAR:  Denies having any chest pain, PND, orthopnea, left lower extremity swelling.  GASTROINTESTINAL:  Decreased appetite, mild abdominal pain in the epigastric area.   GENITOURINARY:  Has been experiencing dysuria.  ENDOCRINE:  No polyuria or polydipsia.  HEMATOLOGY:  History of anemia and thrombocytopenia.  MUSCULOSKELETAL:  Has osteoarthritis.  NEUROLOGIC:  Denies any numbness, any weakness in any part of the body.   PHYSICAL EXAMINATION:   GENERAL:  This is a well-built, well-nourished, age-appropriate female lying down in the bed, not in distress.  VITAL SIGNS:  Temperature 99, pulse 98, blood pressure 119/47, respiratory rate of 20, oxygen saturation 95% on room air.  HEENT:  Head  normocephalic and atraumatic.  Eyes:  No scleral icterus.  Conjunctivae normal.  Pupils equal and reactive to light.  Mucous membranes dry.    NECK:  Supple.  No lymphadenopathy.  No JVD.  No carotid bruits.  CHEST:  Has no focal tenderness.  Could not appreciate any crackles or wheezing.  Good air entry.  HEART:  S1, S2, tachycardia.  ABDOMEN:  Bowel sounds present.  Mild tenderness in the epigastric area, has large splenomegaly.  No rebound or guarding.  EXTREMITIES:  Left lower extremity is somewhat slightly bigger than the right.  Right lower extremity has no pedal edema.  NEUROLOGIC:  The patient is alert, oriented to place, person and time.  Cranial nerves II through XII intact.  No motor or sensory deficits.   LABORATORIES:  WBC count of 49,000, hemoglobin 9.2, platelet count of 71.  BMP:  Glucose 182, sodium 128, total bilirubin 3.5, AST 68.  The rest of all the values are within normal limits.    Troponins are negative.  Coag profile slightly increased PT 70, INR 1.3, PTT 39.    CT chest showed pleural effusions and pulmonary vascular congestion and mild ground glass lung opacities suggestive of mild congestive heart failure.  A focus of infection within the left upper lobe.    ASSESSMENT AND PLAN:   The patient is an 81 year old female comes to the Emergency Department with generalized weakness, cough and fever.  1.  Pneumonia, healthcare associated.  Considering the patient's underlying myelodysplastic syndrome, we will treat with broad-spectrum antibiotics.  We will give vancomycin and cefepime.  Obtain blood cultures in the Emergency Department.  2.  Anemia.  The patient's hemoglobin improved after 1 unit of transfusion.  We will continue to follow.  Per oncology, recommended to obtain repeat CBC on Monday.  3.  Thrombocytopenia.  Again, received transfusion of platelets, currently 72.  No signs of any easy bleeding or bruising.  4.  Hyponatremia.  This could be combination of  pneumonia as well as the patient's seems to be dry, hypovolemic.  We will continue with IV fluids and follow.  5.  Diabetes mellitus.  The patient received CT of the chest.  We will hold the metformin.  Continue glipizide and sliding scale insulin.   6.  Debility.  We will involve physical therapy and occupational therapy.   7.  Constipation.  The patient is on MiraLAX.  This is secondary to the patient being on narcotic and analgesics.   8.  Lower extremity swelling.  We will obtain lower extremity Doppler, concerning about the patient's underlying myelofibrosis.  9.  Abnormal liver enzymes, mildly elevated AST and total bilirubin.  This could be possibly from hemolysis.  10.  Keep the patient on DVT prophylaxis with SCDs.      ____________________________ Susa Griffins, MD pv:ja D: 01/04/2013  05:12:05 ET T: 01/04/2013 09:28:46 ET JOB#: [id number]  cc: Susa GriffinsPadmaja Seferina Brokaw, MD, <Dictator> Susa GriffinsPADMAJA Falynn Ailey MD ELECTRONICALLY SIGNED 01/06/2013 7:17

## 2015-03-05 NOTE — Discharge Summary (Signed)
PATIENT NAME:  Ashley Roth, Ashley Roth MR#:  [id number] DATE OF BIRTH:  04-Aug-1928  DATE OF ADMISSION:  01/04/2013 DATE OF DISCHARGE: 01/08/2013   DISCHARGE DIAGNOSES:  1.  Acute pneumonia.  2.  Symptomatic hyponatremia.  3.  Failure to thrive. 4.  Myelodysplastic syndrome with severe anemia.  5.  Diabetes mellitus, uncontrolled.   DISCHARGE MEDICATIONS: Glipizide 5 mg, 3 tabs daily, metformin 500 mg two twice daily, metoprolol tartrate 50 mg Roth.i.d., tramadol 50 mg q. 6 hours p.r.n. pain, prednisone 10 mg daily, Duragesic patch q. 3 days, Imdur 30 mg daily, MiraLax 17 grams Roth.i.d., magnesium oxide 400 mg Roth.i.d. and levofloxacin 750 mg every other day x 4 doses.   REASON FOR ADMISSION: An 81 year old female, who presents with cough, congestion, fever and volume depletion. Please see history and physical for history of present illness, past medical history and physical exam.   HOSPITAL COURSE: The patient was admitted, hydrated, treated initially with cefepime, which was changed to Levaquin when her LFTs went up. Her LFTs were resolving on discharge. She was found to be mildly hyponatremic. She was counseled to not drink so much water. Normal saline was given. She does have a history of hyponatremia. I do think the hyponatremia contributes to her chronic nausea. She will restrict her fluids better. Overall, her strength improved and she will be going home with physical therapy. Her overall prognosis is poor. Sugars continue to be in the 200s or 300s, although they are better controlled at home by her report.    ____________________________ Danella PentonMark F. Miller, MD mfm:aw D: 01/08/2013 08:19:00 ET T: 01/08/2013 09:56:38 ET JOB#: [id number]  cc: Danella PentonMark F. Miller, MD, <Dictator> MARK Sherlene ShamsF MILLER MD ELECTRONICALLY SIGNED 01/08/2013 12:31
